# Patient Record
Sex: Male | Born: 1985 | Race: White | Hispanic: No | Marital: Married | Smoking: Current every day smoker
Health system: Southern US, Community
[De-identification: ages and names within clinical notes are randomized; demographics above are authoritative.]

## PROBLEM LIST (undated history)

## (undated) DIAGNOSIS — F1020 Alcohol dependence, uncomplicated: Secondary | ICD-10-CM

## (undated) DIAGNOSIS — IMO0001 Reserved for inherently not codable concepts without codable children: Secondary | ICD-10-CM

## (undated) DIAGNOSIS — J45909 Unspecified asthma, uncomplicated: Secondary | ICD-10-CM

## (undated) DIAGNOSIS — I10 Essential (primary) hypertension: Secondary | ICD-10-CM

## (undated) HISTORY — PX: CLUB FOOT RELEASE: SHX1363

---

## 2005-04-03 ENCOUNTER — Emergency Department (HOSPITAL_COMMUNITY): Admission: EM | Admit: 2005-04-03 | Discharge: 2005-04-03 | Payer: Self-pay | Admitting: Emergency Medicine

## 2005-07-15 ENCOUNTER — Emergency Department (HOSPITAL_COMMUNITY): Admission: EM | Admit: 2005-07-15 | Discharge: 2005-07-15 | Payer: Self-pay | Admitting: Emergency Medicine

## 2011-06-13 ENCOUNTER — Emergency Department (HOSPITAL_COMMUNITY)
Admission: EM | Admit: 2011-06-13 | Discharge: 2011-06-13 | Disposition: A | Discharge status: Home / Self Care | Payer: Worker's Compensation | Attending: Emergency Medicine | Admitting: Emergency Medicine

## 2011-06-13 DIAGNOSIS — W260XXA Contact with knife, initial encounter: Secondary | ICD-10-CM | POA: Insufficient documentation

## 2011-06-13 DIAGNOSIS — S61409A Unspecified open wound of unspecified hand, initial encounter: Secondary | ICD-10-CM | POA: Insufficient documentation

## 2011-06-13 DIAGNOSIS — Y9269 Other specified industrial and construction area as the place of occurrence of the external cause: Secondary | ICD-10-CM | POA: Insufficient documentation

## 2014-11-19 ENCOUNTER — Emergency Department (HOSPITAL_COMMUNITY)
Admission: EM | Admit: 2014-11-19 | Discharge: 2014-11-19 | Disposition: A | Discharge status: Home / Self Care | Payer: BLUE CROSS/BLUE SHIELD | Attending: Emergency Medicine | Admitting: Emergency Medicine

## 2014-11-19 ENCOUNTER — Encounter (HOSPITAL_COMMUNITY): Payer: Self-pay | Admitting: *Deleted

## 2014-11-19 DIAGNOSIS — Z008 Encounter for other general examination: Secondary | ICD-10-CM | POA: Diagnosis present

## 2014-11-19 DIAGNOSIS — F1029 Alcohol dependence with unspecified alcohol-induced disorder: Secondary | ICD-10-CM

## 2014-11-19 DIAGNOSIS — F1120 Opioid dependence, uncomplicated: Secondary | ICD-10-CM | POA: Diagnosis present

## 2014-11-19 DIAGNOSIS — I10 Essential (primary) hypertension: Secondary | ICD-10-CM | POA: Insufficient documentation

## 2014-11-19 DIAGNOSIS — Z720 Tobacco use: Secondary | ICD-10-CM | POA: Insufficient documentation

## 2014-11-19 DIAGNOSIS — F1110 Opioid abuse, uncomplicated: Secondary | ICD-10-CM | POA: Diagnosis not present

## 2014-11-19 DIAGNOSIS — F1210 Cannabis abuse, uncomplicated: Secondary | ICD-10-CM | POA: Diagnosis not present

## 2014-11-19 DIAGNOSIS — F10230 Alcohol dependence with withdrawal, uncomplicated: Secondary | ICD-10-CM | POA: Diagnosis present

## 2014-11-19 HISTORY — DX: Alcohol dependence, uncomplicated: F10.20

## 2014-11-19 HISTORY — DX: Reserved for inherently not codable concepts without codable children: IMO0001

## 2014-11-19 HISTORY — DX: Essential (primary) hypertension: I10

## 2014-11-19 LAB — COMPREHENSIVE METABOLIC PANEL
ALBUMIN: 4.8 g/dL (ref 3.5–5.2)
ALT: 25 U/L (ref 0–53)
AST: 35 U/L (ref 0–37)
Alkaline Phosphatase: 73 U/L (ref 39–117)
Anion gap: 9 (ref 5–15)
BUN: 13 mg/dL (ref 6–23)
CALCIUM: 9.4 mg/dL (ref 8.4–10.5)
CHLORIDE: 105 mmol/L (ref 96–112)
CO2: 26 mmol/L (ref 19–32)
Creatinine, Ser: 0.79 mg/dL (ref 0.50–1.35)
GFR calc Af Amer: >90 mL/min (ref >90)
GFR calc non Af Amer: >90 mL/min (ref >90)
Glucose, Bld: 93 mg/dL (ref 70–99)
POTASSIUM: 3.8 mmol/L (ref 3.5–5.1)
SODIUM: 140 mmol/L (ref 135–145)
TOTAL PROTEIN: 8.1 g/dL (ref 6.0–8.3)
Total Bilirubin: 0.8 mg/dL (ref 0.3–1.2)

## 2014-11-19 LAB — ETHANOL

## 2014-11-19 LAB — CBC
HEMATOCRIT: 48.9 % (ref 39.0–52.0)
Hemoglobin: 16.8 g/dL (ref 13.0–17.0)
MCH: 33.4 pg (ref 26.0–34.0)
MCHC: 34.4 g/dL (ref 30.0–36.0)
MCV: 97.2 fL (ref 78.0–100.0)
PLATELETS: 235 10*3/uL (ref 150–400)
RBC: 5.03 MIL/uL (ref 4.22–5.81)
RDW: 12.5 % (ref 11.5–15.5)
WBC: 13.2 10*3/uL — ABNORMAL HIGH (ref 4.0–10.5)

## 2014-11-19 LAB — RAPID URINE DRUG SCREEN, HOSP PERFORMED
Amphetamines: NOT DETECTED
BARBITURATES: NOT DETECTED
BENZODIAZEPINES: NOT DETECTED
Cocaine: NOT DETECTED
Opiates: POSITIVE — AB
Tetrahydrocannabinol: POSITIVE — AB

## 2014-11-19 LAB — SALICYLATE LEVEL

## 2014-11-19 LAB — ACETAMINOPHEN LEVEL: Acetaminophen (Tylenol), Serum: <10 ug/mL — ABNORMAL LOW (ref 10–30)

## 2014-11-19 MED ORDER — LORAZEPAM 1 MG PO TABS: 0.0000 mg | ORAL_TABLET | Freq: Two times a day (BID) | ORAL | Status: DC

## 2014-11-19 MED ORDER — ONDANSETRON HCL 4 MG PO TABS: 4.0000 mg | ORAL_TABLET | Freq: Three times a day (TID) | ORAL | Status: DC | PRN

## 2014-11-19 MED ORDER — LORAZEPAM 1 MG PO TABS
0.0000 mg | ORAL_TABLET | Freq: Four times a day (QID) | ORAL | Status: DC
  Administered 2014-11-19: 1 mg via ORAL
  Filled 2014-11-19: qty 1

## 2014-11-19 MED ORDER — ZOLPIDEM TARTRATE 5 MG PO TABS: 5.0000 mg | ORAL_TABLET | Freq: Every evening | ORAL | Status: DC | PRN

## 2014-11-19 MED ORDER — NICOTINE 21 MG/24HR TD PT24
21.0000 mg | MEDICATED_PATCH | Freq: Every day | TRANSDERMAL | Status: DC
  Administered 2014-11-19: 21 mg via TRANSDERMAL
  Filled 2014-11-19: qty 1

## 2014-11-19 MED ORDER — IBUPROFEN 200 MG PO TABS: 600.0000 mg | ORAL_TABLET | Freq: Three times a day (TID) | ORAL | Status: DC | PRN

## 2014-11-19 MED ORDER — LORAZEPAM 1 MG PO TABS: 1.0000 mg | ORAL_TABLET | Freq: Four times a day (QID) | ORAL | Status: DC | PRN

## 2014-11-19 NOTE — BH Assessment (Signed)
Pt signed consent to release info for [facility], [facility] and [facility]. Pt given following outpatient resources including info for [facility].  [facility]    [facility] [address]     [address] [place]     [place], [place] [phone]      [phone] Both a day and evening program   *Accepts MCD  [facility]  [facility] [address]     [address] [place]      [place], [place] [address] [phone]      [phone]  [facility]: [facility]    [facility] - Intensive Outpatient [address]     [address] [place], [place] [address]     [place], [place]  [phone]      [phone] [address] [place], [place] [address] [phone] *Accepts MCD      Residential Treatment Programs ASAP Residential Treatment    [facility] [address]     [address] [place]      [place], [place] [phone]      [phone] or [phone]  [facility]     The [address] (Several in [place]) [address], [place] [address]    [address] [place] [address]     [place], [place] [phone]      [phone]  [facility]   [facility] ([facility]) [address]     [address] [place], [place] [address]     [place], [place] [phone]      [phone] Admissions: 8am-3pm M-F  Self-Help/Support Groups [facility]   Narcotics Anonymous (NA) Variety of support groups    [facility] [phone] (call for more info)    [address]        [place] - 2 meetings at this location These referrals have been provided to you as appropriate for your clinical needs while taking into account your financial concerns. Please  be aware that agencies, practitioners and insurance companies sometimes change contracts. When calling to make an appointment have your insurance information available so the professional you are going to see can confirm whether they are covered by your plan. Take this form with you in case the person you are seeing needs a copy or to contact us.  __________________________________________ Assessment Counselor  Evette Cristalaroline Paige Armanie Martine, [place]CSWA Assessment Counselor

## 2014-11-19 NOTE — ED Notes (Signed)
Black suitcase removed from locker #36 to locker #27.

## 2014-11-19 NOTE — ED Provider Notes (Signed)
CSN: [id number]     Arrival date & time 11/19/14  1449 History   First MD Initiated Contact with Patient 11/19/14 1550     Chief Complaint  Patient presents with  . Medical Clearance     (Consider location/radiation/quality/duration/timing/severity/associated sxs/prior Treatment) HPI Christopher Molina is a 29 y.o. male with hx of htn, alcohol abuse, presents to ED requesting detox. Pt reports daily drinking and using opiod pills. Denies IV drug use. Admits to marijuana use. Denies any medical complaints. Denies thinking about hurting himself or anyone else. No hx of detox in the past. States tried to stop drinking on his own but gets "shakes." Nothing helping his symptoms. Nothing making them worse. States supposed to be seeing a therapist but he is not.   Past Medical History  Diagnosis Date  . Alcoholism /alcohol abuse   . Hypertension    Past Surgical History  Procedure Laterality Date  . Club foot release     No family history on file. History  Substance Use Topics  . Smoking status: Current Every Day Smoker  . Smokeless tobacco: Not on file  . Alcohol Use: Yes     Comment: 12pk a day    Review of Systems  Constitutional: Negative for fever and chills.  Respiratory: Negative for cough, chest tightness and shortness of breath.   Cardiovascular: Negative for chest pain, palpitations and leg swelling.  Gastrointestinal: Negative for nausea, vomiting, abdominal pain, diarrhea and abdominal distention.  Genitourinary: Negative for dysuria, urgency, frequency and hematuria.  Musculoskeletal: Negative for myalgias, arthralgias, neck pain and neck stiffness.  Skin: Negative for rash.  Allergic/Immunologic: Negative for immunocompromised state.  Neurological: Negative for dizziness, weakness, light-headedness, numbness and headaches.  Psychiatric/Behavioral: Negative for confusion and agitation.      Allergies  Review of patient's allergies indicates no known  allergies.  Home Medications   Prior to Admission medications   Not on File   BP 164/92 mmHg  Pulse 96  Temp(Src) 98 F (36.7 C) (Oral)  Resp 18  SpO2 100% Physical Exam  Constitutional: He is oriented to person, place, and time. He appears well-developed and well-nourished. No distress.  HENT:  Head: Normocephalic and atraumatic.  Eyes: Conjunctivae are normal.  Neck: Neck supple.  Cardiovascular: Normal rate, regular rhythm and normal heart sounds.   Pulmonary/Chest: Effort normal. No respiratory distress. He has no wheezes. He has no rales.  Abdominal: Soft. Bowel sounds are normal. He exhibits no distension. There is no tenderness. There is no rebound.  Musculoskeletal: He exhibits no edema.  Neurological: He is alert and oriented to person, place, and time.  Skin: Skin is warm and dry.  Nursing note and vitals reviewed.   ED Course  Procedures (including critical care time) Labs Review Labs Reviewed  ACETAMINOPHEN LEVEL - Abnormal; Notable for the following:    Acetaminophen (Tylenol), Serum <10.0 (*)    All other components within normal limits  CBC - Abnormal; Notable for the following:    WBC 13.2 (*)    All other components within normal limits  URINE RAPID DRUG SCREEN (HOSP PERFORMED) - Abnormal; Notable for the following:    Opiates POSITIVE (*)    Tetrahydrocannabinol POSITIVE (*)    All other components within normal limits  COMPREHENSIVE METABOLIC PANEL  ETHANOL  SALICYLATE LEVEL    Imaging Review No results found.   EKG Interpretation None      MDM   Final diagnoses:  Alcohol dependence with unspecified alcohol-induced disorder  Opiate  abuse, continuous    Pt here for alcohol and opiods detox. No hx of detox. Last drank yesterday. Will start on CIWA. TTS consult.    1:10 AM Pt medically cleared. No SI or HI   Lottie Musselatyana A Meilah Delrosario, PA-C 11/20/14 0110  Toy BakerAnthony T Allen, MD 11/21/14 [id number]

## 2014-11-19 NOTE — BH Assessment (Addendum)
Assessment Note  Christopher Molina is an 29 y.o. male. Writer spoke w/ Christopher Fillers PA-C prior to assessment. UDS & BAL had not been collected at time of assessment. Pt's cousins are bedside during assesment - Christopher Molina & Christopher Molina.[phone]  Pt is cooperative. He is oriented x 4. Pt's affect is irritable and anxious and he gives one word answers. Pt reports he has been ingesting approx 10 mg percocet 6 to 7 pills daily for past 4 years. He says that he also uses vicodin when available. Pt says he swallows the pills whole. Pt's last use was 11/18/14. Pt sts he has been drinking approx twelve 12 oz beers daily for the past 4 years. Pt sts his last use was 11/18/14. Pt denies hx of substance abuse. Pt denies SI and HI. He denies [facility] and no delusions noted. Pt sts he decided to get detox for alcohol and opioids for his family - pt lives w/ his wife and his 84 yo daughter. Pt sts he works days at [employer] in [place]. Pt reports following depressive sxs: loss of interest in usual pleasures, isolating bx, hopelessness, guilt, worthlessness, irritability. He reports poor appetite with unintentional loss of 10 lbs and decreased sleep. He reports moderate anxiety and reports most recent panic attack was two years ago after his grandmother's death. He reports family hx of substance abuse, suicide and mental illness. Pt reports no hx of inpatient or outpatient MH treatment. Christopher Molina reports she spoke w/ Christopher Molina of CD IOP at [facility] earlier today and Christopher Squibb wants pt to enroll in CD IOP. Writer ran pt by Christopher Means NP who recommends outpatient SA treatment as pt doesn't meet inpatient criteria at [facility] d/t no SI, HI or [facility].    Axis I:  Opioid Use Disorder, Severe             Alcohol Use Disorder, Moderate            Substance Induced Mood D/O Axis II: Deferred Axis III:  Past Medical History  Diagnosis Date  . Alcoholism /alcohol abuse   . Hypertension    Axis IV: other psychosocial or environmental problems,  problems related to social environment and problems with primary support group Axis V: 51-60 moderate symptoms  Past Medical History:  Past Medical History  Diagnosis Date  . Alcoholism /alcohol abuse   . Hypertension     Past Surgical History  Procedure Laterality Date  . Club foot release      Family History: No family history on file.  Social History:  reports that he has been smoking.  He does not have any smokeless tobacco history on file. He reports that he drinks alcohol. He reports that he uses illicit drugs (Marijuana and Oxycodone).  Additional Social History:  Alcohol / Drug Use Pain Medications: pt reports abuse of opioids Prescriptions: pt reports abuse of opioids Over the Counter: pt reports abuse of opioids History of alcohol / drug use?: Yes Longest period of sobriety (when/how long): not amount of cleana and sober time in past 4 yrs Negative Consequences of Use: Personal relationships Substance #1 Name of Substance 1: opioids 1 - Age of First Use: 24 1 - Amount (size/oz): approx 6 to 7 perocet 10 mg or vicodin  1 - Frequency: daily 1 - Duration: for past 4 years 1 - Last Use / Amount: 11/18/14 - unknown amount Substance #2 Name of Substance 2: alcohol 2 - Age of First Use: teenager 2 - Amount (size/oz):  twelve 12 oz beers 2 - Frequency: daily 2 - Duration: for 4 years 2 - Last Use / Amount: 11/18/14 - unknown amount  CIWA: CIWA-Ar BP: 162/90 mmHg Pulse Rate: 93 Nausea and Vomiting: no nausea and no vomiting Tactile Disturbances: none Tremor: no tremor Auditory Disturbances: very mild harshness or ability to frighten Paroxysmal Sweats: barely perceptible sweating, palms moist Visual Disturbances: not present Anxiety: moderately anxious, or guarded, so anxiety is inferred Headache, Fullness in Head: mild Agitation: somewhat more than normal activity Orientation and Clouding of Sensorium: oriented and can do serial additions CIWA-Ar Total: 9 COWS:     Allergies: No Known Allergies  Home Medications:  (Not in a hospital admission)  OB/GYN Status:  No LMP for male patient.  General Assessment Data Location of Assessment: [facility] Is this a Tele or Face-to-Face Assessment?: Face-to-Face Is this an Initial Assessment or a Re-assessment for this encounter?: Initial Assessment Living Arrangements: Spouse/significant other, Children, Other (Comment) (wife & 71 yo girl) Can pt return to current living arrangement?: Yes Admission Status: Voluntary Is patient capable of signing voluntary admission?: Yes Transfer from: Home Referral Source: Self/Family/Friend     [facility] Crisis Care Plan Living Arrangements: Spouse/significant other, Children, Other (Comment) (wife & 57 yo girl) Name of Psychiatrist: none Name of Therapist: none  Education Status Is patient currently in school?: No Highest grade of school patient has completed: 9  Risk to self with the past 6 months Suicidal Ideation: No Suicidal Intent: No Is patient at risk for suicide?: No Suicidal Plan?: No Access to Molina: No What has been your use of drugs/alcohol within the last 12 months?: daily alcohol and opioid use Previous Attempts/Gestures: No How many times?: 0 Other Self Harm Risks: none Triggers for Past Attempts:  (n/a) Intentional Self Injurious Behavior: None Family Suicide History: Yes (he endorses family hx of suicide hx, SA & MI) Recent stressful life event(s): Other (Comment) (substance abuse) Persecutory voices/beliefs?: No Depression: Yes Depression Symptoms: Loss of interest in usual pleasures, Feeling angry/irritable, Feeling worthless/self pity, Fatigue, Guilt, Isolating, Insomnia, Despondent Substance abuse history and/or treatment for substance abuse?: Yes Suicide prevention information given to non-admitted patients: Not applicable  Risk to Others within the past 6 months Homicidal Ideation: No Thoughts of Harm to Others: No Current Homicidal  Intent: No Current Homicidal Plan: No Access to Homicidal Molina: No Identified Victim: none History of harm to others?: No Assessment of Violence: None Noted Violent Behavior Description: pt denies hx violence Does patient have access to weapons?: No Criminal Charges Pending?: No Does patient have a court date: No  Psychosis Hallucinations: None noted Delusions: None noted  Mental Status Report Appear/Hygiene: In hospital gown Eye Contact: Fair Motor Activity: Freedom of movement Speech: Logical/coherent Level of Consciousness: Alert, Quiet/awake, Irritable Mood: Depressed, Anhedonia, Sad, Irritable Affect: Appropriate to circumstance, Anxious, Irritable Anxiety Level: Moderate Thought Processes: Relevant, Coherent Judgement: Unimpaired Orientation: Place, Person, Time, Situation Obsessive Compulsive Thoughts/Behaviors: None  Cognitive Functioning Concentration: Normal Memory: Recent Intact, Remote Intact IQ: Average Insight: Fair Impulse Control: Poor Appetite: Poor Weight Loss: 10 Sleep: Decreased Total Hours of Sleep: 4 Vegetative Symptoms: None  ADLScreening [facility] Assessment Services) Patient's cognitive ability adequate to safely complete daily activities?: Yes Patient able to express need for assistance with ADLs?: Yes Independently performs ADLs?: Yes (appropriate for developmental age)  Prior Inpatient Therapy Prior Inpatient Therapy: No Prior Therapy Dates: na Prior Therapy Facilty/Provider(s): na Reason for Treatment: na  Prior Outpatient Therapy Prior Outpatient Therapy: No Prior Therapy Dates:  na Prior Therapy Facilty/Provider(s): na Reason for Treatment: na  ADL Screening (condition at time of admission) Patient's cognitive ability adequate to safely complete daily activities?: Yes Is the patient deaf or have difficulty hearing?: No Does the patient have difficulty seeing, even when wearing glasses/contacts?: No Does the patient have  difficulty concentrating, remembering, or making decisions?: No Patient able to express need for assistance with ADLs?: Yes Does the patient have difficulty dressing or bathing?: No Independently performs ADLs?: Yes (appropriate for developmental age) Does the patient have difficulty walking or climbing stairs?: No Weakness of Legs: None Weakness of Arms/Hands: None  Home Assistive Devices/Equipment Home Assistive Devices/Equipment: None    Abuse/Neglect Assessment (Assessment to be complete while patient is alone) Physical Abuse: Denies Verbal Abuse: Denies Sexual Abuse: Denies Exploitation of patient/patient's resources: Denies Self-Neglect: Denies     Merchant navy officerAdvance Directives (For Healthcare) Does patient have an advance directive?: No    Additional Information 1:1 In Past 12 Months?: No CIRT Risk: No Elopement Risk: No Does patient have medical clearance?: Yes     Disposition:  Disposition Initial Assessment Completed for this Encounter: Yes Disposition of Patient: Outpatient treatment Type of outpatient treatment: Chemical Dependence - Intensive Outpatient Catha Nottingham(jamison lord NP recommends outpatient IOP)  On Site Evaluation by:   Reviewed with Physician:    Donnamarie RossettiMCLEAN, Saida Lonon P 11/19/2014 5:18 PM

## 2014-11-19 NOTE — ED Notes (Signed)
Patient discharged in care of the his Cousin. Escorted out by [employer] Sophie. Patient ambulatory. Not in any distress. Vital signs stable. Discharged instructions explain to the patient and understanding verbalized. Prescription given and instructions explained.

## 2014-11-19 NOTE — ED Notes (Signed)
Meal tray given to patient.

## 2014-11-19 NOTE — ED Notes (Signed)
Pt reports etoh use, 12pk a day for past 3 years. Percocet 10mg , taking 6-7 a day for 4 years. Marijuana use every day. Pt requesting detox and medical clearance. Denies SI/HI, AVH.

## 2014-11-19 NOTE — ED Notes (Signed)
Christopher RacerCousin [name] [phone]

## 2014-11-19 NOTE — ED Notes (Addendum)
Patient belongings sheet was completed and patient belongings are secured in locker 236. Patient's belongings include 4 shirts, 4 pants/shorts, 5 pairs of socks, 3 undergarments, 1 sweater/jacket, toothpaste, 1 toiletry bag with deodorant, toothbrush, and mouthwash in one black travel suitcase.

## 2014-11-19 NOTE — ED Notes (Signed)
Social Work at bedside 

## 2014-11-20 ENCOUNTER — Emergency Department: Payer: Self-pay | Admitting: Emergency Medicine

## 2014-11-20 LAB — COMPREHENSIVE METABOLIC PANEL
ALBUMIN: 4.2 g/dL (ref 3.4–5.0)
ALK PHOS: 69 U/L (ref 46–116)
AST: 35 U/L (ref 15–37)
Anion Gap: 7 (ref 7–16)
BUN: 13 mg/dL (ref 7–18)
Bilirubin,Total: 0.5 mg/dL (ref 0.2–1.0)
CALCIUM: 8.8 mg/dL (ref 8.5–10.1)
CO2: 30 mmol/L (ref 21–32)
Chloride: 102 mmol/L (ref 98–107)
Creatinine: 1.05 mg/dL (ref 0.60–1.30)
EGFR (African American): >60
EGFR (Non-African Amer.): >60
Glucose: 80 mg/dL (ref 65–99)
OSMOLALITY: 277 (ref 275–301)
Potassium: 3.1 mmol/L — ABNORMAL LOW (ref 3.5–5.1)
SGPT (ALT): 33 U/L (ref 14–63)
Sodium: 139 mmol/L (ref 136–145)
Total Protein: 7.7 g/dL (ref 6.4–8.2)

## 2014-11-20 LAB — CBC
HCT: 47.1 % (ref 40.0–52.0)
HGB: 15.7 g/dL (ref 13.0–18.0)
MCH: 32.9 pg (ref 26.0–34.0)
MCHC: 33.3 g/dL (ref 32.0–36.0)
MCV: 99 fL (ref 80–100)
PLATELETS: 214 10*3/uL (ref 150–440)
RBC: 4.76 10*6/uL (ref 4.40–5.90)
RDW: 13 % (ref 11.5–14.5)
WBC: 10.6 10*3/uL (ref 3.8–10.6)

## 2014-11-20 LAB — URINALYSIS, COMPLETE
Bacteria: NONE SEEN
Bilirubin,UR: NEGATIVE
Blood: NEGATIVE
Glucose,UR: NEGATIVE mg/dL (ref 0–75)
KETONE: NEGATIVE
Leukocyte Esterase: NEGATIVE
NITRITE: NEGATIVE
Ph: 6 (ref 4.5–8.0)
Protein: NEGATIVE
RBC, UR: NONE SEEN /HPF (ref 0–5)
SPECIFIC GRAVITY: 1.004 (ref 1.003–1.030)
Squamous Epithelial: NONE SEEN

## 2014-11-20 LAB — DRUG SCREEN, URINE
Amphetamines, Ur Screen: NEGATIVE (ref ?–1000)
BARBITURATES, UR SCREEN: NEGATIVE (ref ?–200)
Benzodiazepine, Ur Scrn: NEGATIVE (ref ?–200)
CANNABINOID 50 NG, UR ~~LOC~~: NEGATIVE (ref ?–50)
COCAINE METABOLITE, UR ~~LOC~~: NEGATIVE (ref ?–300)
MDMA (ECSTASY) UR SCREEN: NEGATIVE (ref ?–500)
Methadone, Ur Screen: NEGATIVE (ref ?–300)
OPIATE, UR SCREEN: POSITIVE (ref ?–300)
Phencyclidine (PCP) Ur S: NEGATIVE (ref ?–25)
Tricyclic, Ur Screen: NEGATIVE (ref ?–1000)

## 2014-11-20 LAB — ACETAMINOPHEN LEVEL: Acetaminophen: <2 ug/mL

## 2014-11-20 LAB — ETHANOL

## 2014-11-20 LAB — SALICYLATE LEVEL: Salicylates, Serum: 4 mg/dL — ABNORMAL HIGH

## 2015-08-03 ENCOUNTER — Encounter (HOSPITAL_COMMUNITY): Payer: Self-pay | Admitting: Emergency Medicine

## 2015-08-03 ENCOUNTER — Emergency Department (HOSPITAL_COMMUNITY)
Admission: EM | Admit: 2015-08-03 | Discharge: 2015-08-03 | Disposition: A | Discharge status: Home / Self Care | Payer: BLUE CROSS/BLUE SHIELD | Attending: Emergency Medicine | Admitting: Emergency Medicine

## 2015-08-03 ENCOUNTER — Emergency Department (HOSPITAL_COMMUNITY): Payer: BLUE CROSS/BLUE SHIELD

## 2015-08-03 DIAGNOSIS — R05 Cough: Secondary | ICD-10-CM | POA: Diagnosis present

## 2015-08-03 DIAGNOSIS — I10 Essential (primary) hypertension: Secondary | ICD-10-CM | POA: Insufficient documentation

## 2015-08-03 DIAGNOSIS — Z720 Tobacco use: Secondary | ICD-10-CM | POA: Diagnosis not present

## 2015-08-03 DIAGNOSIS — J0120 Acute ethmoidal sinusitis, unspecified: Secondary | ICD-10-CM | POA: Insufficient documentation

## 2015-08-03 DIAGNOSIS — J45909 Unspecified asthma, uncomplicated: Secondary | ICD-10-CM

## 2015-08-03 DIAGNOSIS — J45901 Unspecified asthma with (acute) exacerbation: Secondary | ICD-10-CM | POA: Insufficient documentation

## 2015-08-03 MED ORDER — IPRATROPIUM-ALBUTEROL 0.5-2.5 (3) MG/3ML IN SOLN
3.0000 mL | Freq: Once | RESPIRATORY_TRACT | Status: AC
  Administered 2015-08-03: 3 mL via RESPIRATORY_TRACT
  Filled 2015-08-03: qty 3

## 2015-08-03 MED ORDER — AMOXICILLIN-POT CLAVULANATE 875-125 MG PO TABS: 1.0000 | ORAL_TABLET | Freq: Two times a day (BID) | ORAL | Status: DC

## 2015-08-03 MED ORDER — ALBUTEROL SULFATE HFA 108 (90 BASE) MCG/ACT IN AERS: 1.0000 | INHALATION_SPRAY | Freq: Four times a day (QID) | RESPIRATORY_TRACT | Status: DC | PRN

## 2015-08-03 NOTE — Discharge Instructions (Signed)
Asthma Attack Prevention While you may not be able to control the fact that you have asthma, you can take actions to prevent asthma attacks. The best way to prevent asthma attacks is to maintain good control of your asthma. You can achieve this by:  Taking your medicines as directed.  Avoiding things that can irritate your airways or make your asthma symptoms worse (asthma triggers).  Keeping track of how well your asthma is controlled and of any changes in your symptoms.  Responding quickly to worsening asthma symptoms (asthma attack).  Seeking emergency care when it is needed. WHAT ARE SOME WAYS TO PREVENT AN ASTHMA ATTACK? Have a Plan Work with your health care provider to create a written plan for managing and treating your asthma attacks (asthma action plan). This plan includes:  A list of your asthma triggers and how you can avoid them.  Information on when medicines should be taken and when their dosages should be changed.  The use of a device that measures how well your lungs are working (peak flow meter). Monitor Your Asthma Use your peak flow meter and record your results in a journal every day. A drop in your peak flow numbers on one or more days may indicate the start of an asthma attack. This can happen even before you start to feel symptoms. You can prevent an asthma attack from getting worse by following the steps in your asthma action plan. Avoid Asthma Triggers Work with your asthma health care provider to find out what your asthma triggers are. This can be done by:  Allergy testing.  Keeping a journal that notes when asthma attacks occur and the factors that may have contributed to them.  Determining if there are other medical conditions that are making your asthma worse. Once you have determined your asthma triggers, take steps to avoid them. This may include avoiding excessive or prolonged exposure to:  Dust. Have someone dust and vacuum your home for you once or  twice a week. Using a high-efficiency particulate arrestance (HEPA) vacuum is best.  Smoke. This includes campfire smoke, forest fire smoke, and secondhand smoke from tobacco products.  Pet dander. Avoid contact with animals that you know you are allergic to.  Allergens from trees, grasses or pollens. Avoid spending a lot of time outdoors when pollen counts are high, and on very windy days.  Very cold, dry, or humid air.  Mold.  Foods that contain high amounts of sulfites.  Strong odors.  Outdoor air pollutants, such as engine exhaust.  Indoor air pollutants, such as aerosol sprays and fumes from household cleaners.  Household pests, including dust mites and cockroaches, and pest droppings.  Certain medicines, including NSAIDs. Always talk to your health care provider before stopping or starting any new medicines. Medicines Take over-the-counter and prescription medicines only as told by your health care provider. Many asthma attacks can be prevented by carefully following your medicine schedule. Taking your medicines correctly is especially important when you cannot avoid certain asthma triggers. Act Quickly If an asthma attack does happen, acting quickly can decrease how severe it is and how long it lasts. Take these steps:   Pay attention to your symptoms. If you are coughing, wheezing, or having difficulty breathing, do not wait to see if your symptoms go away on their own. Follow your asthma action plan.  If you have followed your asthma action plan and your symptoms are not improving, call your health care provider or seek immediate medical care   at the nearest hospital. It is important to note how often you need to use your fast-acting rescue inhaler. If you are using your rescue inhaler more often, it may mean that your asthma is not under control. Adjusting your asthma treatment plan may help you to prevent future asthma attacks and help you to gain better control of your  condition. HOW CAN I PREVENT AN ASTHMA ATTACK WHEN I EXERCISE? Follow advice from your health care provider about whether you should use your fast-acting inhaler before exercising. Many people with asthma experience exercise-induced bronchoconstriction (EIB). This condition often worsens during vigorous exercise in cold, humid, or dry environments. Usually, people with EIB can stay very active by pre-treating with a fast-acting inhaler before exercising.   This information is not intended to replace advice given to you by your health care provider. Make sure you discuss any questions you have with your health care provider.   Document Released: 09/26/2009 Document Revised: 06/29/2015 Document Reviewed: 03/10/2015 Elsevier Interactive Patient Education 2016 [employer]vier Inc.  

## 2015-08-03 NOTE — ED Notes (Signed)
Pt reports sinus infection x2 weeks with head congestions, fever/chills, drainage, sneezing and coughing.  Pt states he has not had any tx for this issue.

## 2015-08-03 NOTE — ED Provider Notes (Signed)
CSN: [id number]     Arrival date & time 08/03/15  4098 History   First MD Initiated Contact with Patient 08/03/15 1024     Chief Complaint  Patient presents with  . Facial Pain     (Consider location/radiation/quality/duration/timing/severity/associated sxs/prior Treatment) Patient is a 29 y.o. male presenting with cough. The history is provided by the patient. No language interpreter was used.  Cough Cough characteristics:  Productive Sputum characteristics:  Nondescript Severity:  Moderate Onset quality:  Gradual Timing:  Constant Progression:  Worsening Chronicity:  New Smoker: no   Context: upper respiratory infection   Relieved by:  Nothing Worsened by:  Nothing tried Ineffective treatments:  None tried Associated symptoms: shortness of breath, sinus congestion and sore throat     Past Medical History  Diagnosis Date  . Alcoholism /alcohol abuse (HCC)   . Hypertension    Past Surgical History  Procedure Laterality Date  . Club foot release     History reviewed. No pertinent family history. Social History  Substance Use Topics  . Smoking status: Current Every Day Smoker -- 1.00 packs/day for 10 years    Types: Cigarettes  . Smokeless tobacco: None  . Alcohol Use: No     Comment: 12pk a day    Review of Systems  HENT: Positive for sore throat.   Respiratory: Positive for cough and shortness of breath.   All other systems reviewed and are negative.     Allergies  Review of patient's allergies indicates no known allergies.  Home Medications   Prior to Admission medications   Medication Sig Start Date End Date Taking? Authorizing Provider  LORazepam (ATIVAN) 1 MG tablet Take 1 tablet (1 mg total) by mouth 4 (four) times daily as needed (withdrawal symptoms). 11/20/14   Charm Rings, NP   BP 143/86 mmHg  Pulse 71  Temp(Src) 98.2 F (36.8 C) (Oral)  Resp 18  Ht  (1.854 m)  Wt 160 lb (72.576 kg)  BMI 21.11 kg/m2  SpO2 98% Physical Exam   Constitutional: He is oriented to person, place, and time. He appears well-developed and well-nourished.  HENT:  Head: Normocephalic.  Right Ear: External ear normal.  Left Ear: External ear normal.  Maxillary tenderness  Eyes: Conjunctivae and EOM are normal. Pupils are equal, round, and reactive to light.  Neck: Normal range of motion.  Cardiovascular: Normal rate and normal heart sounds.   Pulmonary/Chest: Effort normal.  Wheezing    Abdominal: Soft. Bowel sounds are normal.  Musculoskeletal: Normal range of motion.  Neurological: He is alert and oriented to person, place, and time. He has normal reflexes.  Skin: Skin is warm.  Psychiatric: He has a normal mood and affect.  Nursing note and vitals reviewed.   ED Course  Procedures (including critical care time) Labs Review Labs Reviewed - No data to display  Imaging Review Dg Chest 2 View  08/03/2015  CLINICAL DATA:  Cough, fever. EXAM: CHEST  2 VIEW COMPARISON:  None. FINDINGS: The heart size and mediastinal contours are within normal limits. Both lungs are clear. No pneumothorax or pleural effusion is noted. The visualized skeletal structures are unremarkable. IMPRESSION: No active cardiopulmonary disease. Electronically Signed   By: Lupita Raider, M.D.   On: 08/03/2015 11:27   I have personally reviewed and evaluated these images and lab results as part of my medical decision-making.   EKG Interpretation None      MDM  Pt given albuterol/atrovent neb.  Final diagnoses:  Acute ethmoidal sinusitis, recurrence not specified  Bronchial asthma, unspecified asthma severity, uncomplicated    Pt given rx of albuterol and augmentin.  Pt advised to stop smoking.     Lonia SkinnerLeslie K MacySofia, PA-C 08/03/15 1253  Bethann BerkshireJoseph Zammit, MD 08/04/15 1537

## 2018-01-16 ENCOUNTER — Ambulatory Visit (INDEPENDENT_AMBULATORY_CARE_PROVIDER_SITE_OTHER): Payer: Self-pay

## 2018-01-16 ENCOUNTER — Encounter (INDEPENDENT_AMBULATORY_CARE_PROVIDER_SITE_OTHER): Payer: Self-pay | Admitting: Orthopaedic Surgery

## 2018-01-16 ENCOUNTER — Ambulatory Visit (INDEPENDENT_AMBULATORY_CARE_PROVIDER_SITE_OTHER): Payer: BLUE CROSS/BLUE SHIELD | Admitting: Orthopaedic Surgery

## 2018-01-16 VITALS — BP 182/118 | HR 83 | Ht 72.0 in | Wt 178.0 lb

## 2018-01-16 DIAGNOSIS — M545 Low back pain, unspecified: Secondary | ICD-10-CM | POA: Insufficient documentation

## 2018-01-16 NOTE — Addendum Note (Signed)
Addended by: Rogers SeedsYEATTS, Earnest Mcgillis M on: 01/16/2018 11:32 AM   Modules accepted: Orders

## 2018-01-16 NOTE — Progress Notes (Signed)
Office Visit Note   Patient: Christopher Molina           Date of Birth: 03-01-1986           MRN: [id number] Visit Date: 01/16/2018              Requested by: No referring provider defined for this encounter. PCP: Patient, No Pcp Per   Assessment & Plan: Visit Diagnoses:  1. Acute left-sided low back pain, with sciatica presence unspecified         Low back pain left side nerve root tension signs present.  Therapy ordered recheck 4 weeks.  Plan: We will set patient up for physical therapy recheck him in 4 weeks.  He can continue anti-inflammatories continue walking program.  Follow-Up Instructions: No follow-ups on file.   Orders:  Orders Placed This Encounter  Procedures  . XR Lumbar Spine 2-3 Views  . XR Pelvis 1-2 Views   No orders of the defined types were placed in this encounter.     Procedures: No procedures performed   Clinical Data: No additional findings.   Subjective: Chief Complaint  Patient presents with  . Lower Back - Pain  . Left Hip - Pain    HPI 32 year old male seen with onset of back and severe left leg pain times 3 weeks.  He is missed some work he was [facility] by [facility] with prednisone 20 mg 4, 3, 2, 1, Flexeril also ibuprofen.  Patient states originally can put his socks on.  He continues to have back pain left leg pain no pain on the right side.  No associated bowel or bladder symptoms.  History of disc degeneration.  Review of Systems previous clubfoot surgery as an infant times 2.  Patient smokes 1 pack/day he is a Control and instrumentation engineer married.  14 point review of systems otherwise negative.  Past history of alcohol problems and this was 2016.  Denies IV drug abuse.  Previous history of using some opioid pills   Objective: Vital Signs: BP (!) 182/118   Pulse 83   Ht 6' (1.829 m)   Wt 178 lb (80.7 kg)   BMI 24.14 kg/m   Physical Exam  Constitutional: He is oriented to person, place, and time. He appears well-developed and  well-nourished.  HENT:  Head: Normocephalic and atraumatic.  Eyes: Pupils are equal, round, and reactive to light. EOM are normal.  Neck: No tracheal deviation present. No thyromegaly present.  Cardiovascular: Normal rate.  Pulmonary/Chest: Effort normal. He has no wheezes.  Abdominal: Soft. Bowel sounds are normal.  Neurological: He is alert and oriented to person, place, and time.  Skin: Skin is warm and dry. Capillary refill takes less than 2 seconds.  Psychiatric: He has a normal mood and affect. His behavior is normal. Judgment and thought content normal.    Ortho Exam she has positive straight leg raising 80 degrees on the left positive popliteal compression test positive sciatic notch tenderness.  He is able to heel and toe walk.  Specialty Comments:  No specialty comments available.  Imaging: No results found.   PMFS History: Patient Active Problem List   Diagnosis Date Noted  . Alcohol dependence with uncomplicated withdrawal (HCC) 11/19/2014  . Opiate dependence (HCC) 11/19/2014   Past Medical History:  Diagnosis Date  . Alcoholism /alcohol abuse (HCC)   . Hypertension     No family history on file.  Past Surgical History:  Procedure Laterality Date  . CLUB FOOT RELEASE  Social History   Occupational History  . Occupation: Control and instrumentation engineerpress operator  Tobacco Use  . Smoking status: Current Every Day Smoker    Packs/day: 1.00    Years: 10.00    Pack years: 10.00    Types: Cigarettes  . Smokeless tobacco: Never Used  Substance and Sexual Activity  . Alcohol use: Yes    Comment: occasional  . Drug use: Not Currently    Comment: MJ daily, percocet recreationally, 6-7 10mg  tabs a day  . Sexual activity: Not on file

## 2018-02-13 ENCOUNTER — Encounter (INDEPENDENT_AMBULATORY_CARE_PROVIDER_SITE_OTHER): Payer: Self-pay | Admitting: Orthopaedic Surgery

## 2018-02-13 ENCOUNTER — Ambulatory Visit (INDEPENDENT_AMBULATORY_CARE_PROVIDER_SITE_OTHER): Payer: BLUE CROSS/BLUE SHIELD | Admitting: Orthopaedic Surgery

## 2018-02-13 VITALS — BP 143/93 | HR 89 | Ht 72.0 in | Wt 178.0 lb

## 2018-02-13 DIAGNOSIS — M545 Low back pain: Secondary | ICD-10-CM | POA: Diagnosis not present

## 2018-02-13 NOTE — Progress Notes (Signed)
Office Visit Note   Patient: Christopher Molina           Date of Birth: 04/08/1986           MRN: [id number] Visit Date: 02/13/2018              Requested by: No referring provider defined for this encounter. PCP: Patient, No Pcp Per   Assessment & Plan: Visit Diagnoses:  1. Acute left-sided low back pain, with sciatica presence unspecified     Plan: We will proceed with MRI scan for persistent back and left leg pain difficulty working.  Office follow-up after MRI scan for review.  Failed muscle relaxants anti-inflammatories exercise program prednisone Dosepak.  Follow-Up Instructions: No follow-ups on file.   Orders:  Orders Placed This Encounter  Procedures  . MR Lumbar Spine w/o contrast   No orders of the defined types were placed in this encounter.     Procedures: No procedures performed   Clinical Data: No additional findings.   Subjective: Chief Complaint  Patient presents with  . Lower Back - Pain, Follow-up    HPI 32 year old male returns for ongoing problems with back pain and leg pain.  He had one physical therapy visit for home exercise program and states it hurts too much for him to do the exercises.  Ibuprofen 800 mg 3-4 times a day states is not any better.  We discussed maximum 1600 mg ibuprofen recommendations.  He has had symptoms for 2 months.  Prednisone, Flexeril.  Past history of alcohol abuse history of p.o. opioid pill use.  He worked as a Control and instrumentation engineerpress operator.  He continues to smoke 1 pack/day.  Denies fever chills no bowel bladder symptoms.  Review of Systems 14 point review of systems updated unchanged from 01/16/2018 office visit.  Negative for history of IV drug abuse.   Objective: Vital Signs: BP (!) 143/93   Pulse 89   Ht 6' (1.829 m)   Wt 178 lb (80.7 kg)   BMI 24.14 kg/m   Physical Exam  Constitutional: He is oriented to person, place, and time. He appears well-developed and well-nourished.  HENT:  Head: Normocephalic and  atraumatic.  Eyes: Pupils are equal, round, and reactive to light. EOM are normal.  Neck: No tracheal deviation present. No thyromegaly present.  Cardiovascular: Normal rate.  Pulmonary/Chest: Effort normal. He has no wheezes.  Abdominal: Soft. Bowel sounds are normal.  Neurological: He is alert and oriented to person, place, and time.  Skin: Skin is warm and dry. Capillary refill takes less than 2 seconds.  Psychiatric: He has a normal mood and affect. His behavior is normal. Judgment and thought content normal.    Ortho Exam patient has short stride gait.  He has pain on the left side.  Some pain with popliteal compression test.  Is able heel and toe walk.  Reflexes are 2+ and symmetrical.  Specialty Comments:  No specialty comments available.  Imaging: No results found.   PMFS History: Patient Active Problem List   Diagnosis Date Noted  . Acute left-sided low back pain 01/16/2018  . Alcohol dependence with uncomplicated withdrawal (HCC) 11/19/2014  . Opiate dependence (HCC) 11/19/2014   Past Medical History:  Diagnosis Date  . Alcoholism /alcohol abuse (HCC)   . Hypertension     No family history on file.  Past Surgical History:  Procedure Laterality Date  . CLUB FOOT RELEASE     Social History   Occupational History  . Occupation: press  operator  Tobacco Use  . Smoking status: Current Every Day Smoker    Packs/day: 1.00    Years: 10.00    Pack years: 10.00    Types: Cigarettes  . Smokeless tobacco: Never Used  Substance and Sexual Activity  . Alcohol use: Yes    Comment: occasional  . Drug use: Not Currently    Comment: MJ daily, percocet recreationally, 6-7 10mg  tabs a day  . Sexual activity: Not on file

## 2018-02-20 ENCOUNTER — Encounter (INDEPENDENT_AMBULATORY_CARE_PROVIDER_SITE_OTHER): Payer: Self-pay | Admitting: Orthopaedic Surgery

## 2018-02-21 ENCOUNTER — Encounter: Payer: Self-pay | Admitting: Orthopaedic Surgery

## 2018-02-27 ENCOUNTER — Encounter (INDEPENDENT_AMBULATORY_CARE_PROVIDER_SITE_OTHER): Payer: Self-pay | Admitting: Orthopaedic Surgery

## 2018-02-27 ENCOUNTER — Ambulatory Visit (INDEPENDENT_AMBULATORY_CARE_PROVIDER_SITE_OTHER): Payer: BLUE CROSS/BLUE SHIELD | Admitting: Orthopaedic Surgery

## 2018-02-27 DIAGNOSIS — M5116 Intervertebral disc disorders with radiculopathy, lumbar region: Secondary | ICD-10-CM | POA: Diagnosis not present

## 2018-02-27 MED ORDER — HYDROCODONE-ACETAMINOPHEN 5-325 MG PO TABS: ORAL_TABLET | ORAL | 0 refills | Status: DC

## 2018-02-27 NOTE — H&P (View-Only) (Signed)
Office Visit Note   Patient: Christopher Molina           Date of Birth: 06/20/1986           MRN: [id number] Visit Date: 02/27/2018              Requested by: No referring provider defined for this encounter. PCP: Patient, No Pcp Per   Assessment & Plan: Visit Diagnoses:  1. Lumbar disc herniation with radiculopathy      LEFT  L5-S1  Plan: Patient has large disc herniation with nerve root tension signs significant pain failed anti-inflammatories muscle relaxants, prednisone Dosepak.  Has history of alcohol abuse  None currently.  Norco 5/325 30 tablets 1-2 p.o. to 6 hours as needed pain he will not take it when he works and his wife will monitor this.  Left L5-S1 microdiscectomy recommended with overnight stay.  He understands he will be out of work for 6 weeks after the surgery.  Risk of recurrent disc herniation discussed.  Risks of dural tear risks of anesthesia discussed.  That after the surgery with the quick Wollin with his past history of alcohol dependency.  Procedure discussed questions were elicited and answered he understands request to proceed.  He like to continue to work until his surgery is approved and would like to have it scheduled as soon as he can.  Follow-Up Instructions: Preop for left L5-S1 microdiscectomy.  Orders:  No orders of the defined types were placed in this encounter.  Meds ordered this encounter  Medications  . HYDROcodone-acetaminophen (NORCO/VICODIN) 5-325 MG tablet    Sig: Take 1-2 tablets every 4-6 hours prn pain.    Dispense:  30 tablet    Refill:  0      Procedures: No procedures performed   Clinical Data: No additional findings.   Subjective: Chief Complaint  Patient presents with  . Lower Back - Pain, Follow-up    MRI review    HPI 32 year old male returns with ongoing severe back and left leg pain times more than 4 months.  Is been on muscle relaxants, anti-inflammatories prednisone packs without relief.  Sit he can either lay  down or stand up.  His wife is with him today and states something has to be done.  MRI scan has been performed and is available for review.  Patient rates his pain is 6-9 out of 10 but is continued to work.  Physical therapy it made his pain worse he is continue to try to do his home exercises.  At times he was taking more ibuprofen than recommended.  Patient had trouble with alcohol abuse in the past but not currently.  Review of Systems positive for clubfoot surgery as an infant x2.  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  His wife.  Past history of alcohol abuse 2016.  For IV drug abuse.  Patient did use some opioid pills back when he was drinking  heavily 2016.   Objective: Vital Signs: There were no vitals taken for this visit.  Physical Exam  Constitutional: He is oriented to person, place, and time. He appears well-developed and well-nourished.  HENT:  Head: Normocephalic and atraumatic.  Eyes: Pupils are equal, round, and reactive to light. EOM are normal.  Neck: No tracheal deviation present. No thyromegaly present.  Cardiovascular: Normal rate.  Pulmonary/Chest: Effort normal. He has no wheezes.  Abdominal: Soft. Bowel sounds are normal.  Neurological: He is alert and oriented to person, place, and time.  Skin: Skin is warm and dry. Capillary refill takes less than 2 seconds.  Psychiatric: He has a normal mood and affect. His behavior is normal. Judgment and thought content normal.    Ortho Exam patient has extreme sciatic notch tenderness to the left positive straight leg raising 70 degrees.  He can walk on his heels and toes but cannot do single stance left toe raises.  No right sciatic notch tenderness.  Positive popliteal compression test on the left.  Anterior tib EHL is strong both right and left.  Quads are strong.  Normal hip range of motion negative SI joint testing.  Distal pulses are 2+ and symmetrical.  Specialty Comments:  No specialty comments  available.  Imaging: Lumbar MRI 02/23/2008 shows moderate to large disc extrusion on the left with significant mass-effect on the left S1 nerve root.  Mild disc bulge on the right at L4-5 with mild narrowing without compression.  All other levels above this are normal.  This desiccation at L4-5 and L5-S1.   PMFS History: Patient Active Problem List   Diagnosis Date Noted  . Acute left-sided low back pain 01/16/2018  . Alcohol dependence with uncomplicated withdrawal (HCC) 11/19/2014  . Opiate dependence (HCC) 11/19/2014   Past Medical History:  Diagnosis Date  . Alcoholism /alcohol abuse (HCC)   . Hypertension     No family history on file.  Past Surgical History:  Procedure Laterality Date  . CLUB FOOT RELEASE     Social History   Occupational History  . Occupation: press operator  Tobacco Use  . Smoking status: Current Every Day Smoker    Packs/day: 1.00    Years: 10.00    Pack years: 10.00    Types: Cigarettes  . Smokeless tobacco: Never Used  Substance and Sexual Activity  . Alcohol use: Yes    Comment: occasional  . Drug use: Not Currently    Comment: MJ daily, percocet recreationally, 6-7 10mg tabs a day  . Sexual activity: Not on file       

## 2018-02-27 NOTE — Progress Notes (Signed)
Office Visit Note   Patient: Christopher Molina           Date of Birth: 06/20/1986           MRN: [id number] Visit Date: 02/27/2018              Requested by: No referring provider defined for this encounter. PCP: Patient, No Pcp Per   Assessment & Plan: Visit Diagnoses:  1. Lumbar disc herniation with radiculopathy      LEFT  L5-S1  Plan: Patient has large disc herniation with nerve root tension signs significant pain failed anti-inflammatories muscle relaxants, prednisone Dosepak.  Has history of alcohol abuse  None currently.  Norco 5/325 30 tablets 1-2 p.o. to 6 hours as needed pain he will not take it when he works and his wife will monitor this.  Left L5-S1 microdiscectomy recommended with overnight stay.  He understands he will be out of work for 6 weeks after the surgery.  Risk of recurrent disc herniation discussed.  Risks of dural tear risks of anesthesia discussed.  That after the surgery with the quick Wollin with his past history of alcohol dependency.  Procedure discussed questions were elicited and answered he understands request to proceed.  He like to continue to work until his surgery is approved and would like to have it scheduled as soon as he can.  Follow-Up Instructions: Preop for left L5-S1 microdiscectomy.  Orders:  No orders of the defined types were placed in this encounter.  Meds ordered this encounter  Medications  . HYDROcodone-acetaminophen (NORCO/VICODIN) 5-325 MG tablet    Sig: Take 1-2 tablets every 4-6 hours prn pain.    Dispense:  30 tablet    Refill:  0      Procedures: No procedures performed   Clinical Data: No additional findings.   Subjective: Chief Complaint  Patient presents with  . Lower Back - Pain, Follow-up    MRI review    HPI 32 year old male returns with ongoing severe back and left leg pain times more than 4 months.  Is been on muscle relaxants, anti-inflammatories prednisone packs without relief.  Sit he can either lay  down or stand up.  His wife is with him today and states something has to be done.  MRI scan has been performed and is available for review.  Patient rates his pain is 6-9 out of 10 but is continued to work.  Physical therapy it made his pain worse he is continue to try to do his home exercises.  At times he was taking more ibuprofen than recommended.  Patient had trouble with alcohol abuse in the past but not currently.  Review of Systems positive for clubfoot surgery as an infant x2.  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  His wife.  Past history of alcohol abuse 2016.  For IV drug abuse.  Patient did use some opioid pills back when he was drinking  heavily 2016.   Objective: Vital Signs: There were no vitals taken for this visit.  Physical Exam  Constitutional: He is oriented to person, place, and time. He appears well-developed and well-nourished.  HENT:  Head: Normocephalic and atraumatic.  Eyes: Pupils are equal, round, and reactive to light. EOM are normal.  Neck: No tracheal deviation present. No thyromegaly present.  Cardiovascular: Normal rate.  Pulmonary/Chest: Effort normal. He has no wheezes.  Abdominal: Soft. Bowel sounds are normal.  Neurological: He is alert and oriented to person, place, and time.  Skin: Skin is warm and dry. Capillary refill takes less than 2 seconds.  Psychiatric: He has a normal mood and affect. His behavior is normal. Judgment and thought content normal.    Ortho Exam patient has extreme sciatic notch tenderness to the left positive straight leg raising 70 degrees.  He can walk on his heels and toes but cannot do single stance left toe raises.  No right sciatic notch tenderness.  Positive popliteal compression test on the left.  Anterior tib EHL is strong both right and left.  Quads are strong.  Normal hip range of motion negative SI joint testing.  Distal pulses are 2+ and symmetrical.  Specialty Comments:  No specialty comments  available.  Imaging: Lumbar MRI 02/23/2008 shows moderate to large disc extrusion on the left with significant mass-effect on the left S1 nerve root.  Mild disc bulge on the right at L4-5 with mild narrowing without compression.  All other levels above this are normal.  This desiccation at L4-5 and L5-S1.   PMFS History: Patient Active Problem List   Diagnosis Date Noted  . Acute left-sided low back pain 01/16/2018  . Alcohol dependence with uncomplicated withdrawal (HCC) 11/19/2014  . Opiate dependence (HCC) 11/19/2014   Past Medical History:  Diagnosis Date  . Alcoholism /alcohol abuse (HCC)   . Hypertension     No family history on file.  Past Surgical History:  Procedure Laterality Date  . CLUB FOOT RELEASE     Social History   Occupational History  . Occupation: Control and instrumentation engineer  Tobacco Use  . Smoking status: Current Every Day Smoker    Packs/day: 1.00    Years: 10.00    Pack years: 10.00    Types: Cigarettes  . Smokeless tobacco: Never Used  Substance and Sexual Activity  . Alcohol use: Yes    Comment: occasional  . Drug use: Not Currently    Comment: MJ daily, percocet recreationally, 6-7  tabs a day  . Sexual activity: Not on file

## 2018-03-11 ENCOUNTER — Other Ambulatory Visit (INDEPENDENT_AMBULATORY_CARE_PROVIDER_SITE_OTHER): Payer: Self-pay | Admitting: Radiology

## 2018-03-11 ENCOUNTER — Telehealth (INDEPENDENT_AMBULATORY_CARE_PROVIDER_SITE_OTHER): Payer: Self-pay | Admitting: Radiology

## 2018-03-11 MED ORDER — HYDROCODONE-ACETAMINOPHEN 5-325 MG PO TABS: ORAL_TABLET | ORAL | 0 refills | Status: DC

## 2018-03-11 NOTE — Telephone Encounter (Signed)
Ok for Rx for norco 5/325   # 20  One po bid prn pain.

## 2018-03-11 NOTE — Addendum Note (Signed)
Addended by: Penne Lash, Otis Dials on: 03/11/2018 05:37 PM   Modules accepted: Orders

## 2018-03-11 NOTE — Telephone Encounter (Signed)
Patient is scheduled for lumbar surgery on 03/24/18. He left voicemail requesting refill on medication until surgery. Please advise.

## 2018-03-11 NOTE — Telephone Encounter (Signed)
I tried to call to let him know his rx is ready but there was no answer, I will try again later

## 2018-03-14 NOTE — Telephone Encounter (Signed)
Patients wife states that he got an rx at the Dr. [place] yesterday

## 2018-03-18 NOTE — Pre-Procedure Instructions (Addendum)
Christopher Molina  03/18/2018    Your procedure is scheduled on Monday, March 24, 2018 at 12:30 PM.   Report to [facility] Entrance "A" Admitting Office at 10:30 AM.   Call this number if you have problems the morning of surgery: [phone]   Questions prior to day of surgery, please call [phone] between 8 & 4 PM.   Remember:  Do not eat food or drink liquids after midnight, Sunday, 03/23/18.  Take these medicines the morning of surgery with A SIP OF WATER: Hydrocodone - if needed  Stop NSAIDS (Ibuprofen, Aleve, etc) as of today. Do not use Aspirin products prior to surgery.  No smoking 24 hours prior to surgery.    Do not wear jewelry.  Do not wear lotions, powders, cologne or deodorant.  Men may shave face and neck.  Do not bring valuables to the hospital.  [facility] is not responsible for any belongings or valuables.  Contacts, dentures or bridgework may not be worn into surgery.  Leave your suitcase in the car.  After surgery it may be brought to your room.  For patients admitted to the hospital, discharge time will be determined by your treatment team.  Patients discharged the day of surgery will not be allowed to drive home.   [place] - Preparing for Surgery  Before surgery, you can play an important role.  Because skin is not sterile, your skin needs to be as free of germs as possible.  You can reduce the number of germs on you skin by washing with CHG (chlorahexidine gluconate) soap before surgery.  CHG is an antiseptic cleaner which kills germs and bonds with the skin to continue killing germs even after washing.  Oral Hygiene is also important in reducing the risk of infection.  Remember to brush your teeth with your regular toothpaste the morning of surgery.  Please DO NOT use if you have an allergy to CHG or antibacterial soaps.  If your skin becomes reddened/irritated stop using the CHG and inform your nurse when you arrive at Short Stay.  Do not  shave (including legs and underarms) for at least 48 hours prior to the first CHG shower.  You may shave your face.  Please follow these instructions carefully:   1.  Shower with CHG Soap the night before surgery and the morning of Surgery.  2.  If you choose to wash your hair, wash your hair first as usual with your normal shampoo.  3.  After you shampoo, rinse your hair and body thoroughly to remove the shampoo. 4.  Use CHG as you would any other liquid soap.  You can apply chg directly to the skin and wash gently with a      scrungie or washcloth.           5.  Apply the CHG Soap to your body ONLY FROM THE NECK DOWN.   Do not use on open wounds or open sores. Avoid contact with your eyes, ears, mouth and genitals (private parts).  Wash genitals (private parts) with your normal soap.  6.  Wash thoroughly, paying special attention to the area where your surgery will be performed.  7.  Thoroughly rinse your body with warm water from the neck down.  8.  DO NOT shower/wash with your normal soap after using and rinsing off the CHG Soap.  9.  Pat yourself dry with a clean towel.  10.  Wear clean pajamas.            11.  Place clean sheets on your bed the night of your first shower and do not sleep with pets.  Day of Surgery  Shower as above. Do not apply any lotions/deodorants the morning of surgery.   Please wear clean clothes to the hospital. Remember to brush your teeth with toothpaste.    Please read over the fact sheets that you were given.

## 2018-03-19 ENCOUNTER — Encounter (HOSPITAL_COMMUNITY)
Admission: RE | Admit: 2018-03-19 | Discharge: 2018-03-19 | Disposition: A | Discharge status: Home / Self Care | Payer: BLUE CROSS/BLUE SHIELD | Source: Ambulatory Visit | Attending: Orthopaedic Surgery | Admitting: Orthopaedic Surgery

## 2018-03-19 ENCOUNTER — Other Ambulatory Visit: Payer: Self-pay

## 2018-03-19 ENCOUNTER — Encounter (HOSPITAL_COMMUNITY): Payer: Self-pay

## 2018-03-19 ENCOUNTER — Other Ambulatory Visit (HOSPITAL_COMMUNITY): Payer: Self-pay | Admitting: *Deleted

## 2018-03-19 DIAGNOSIS — I10 Essential (primary) hypertension: Secondary | ICD-10-CM | POA: Diagnosis not present

## 2018-03-19 DIAGNOSIS — Z01810 Encounter for preprocedural cardiovascular examination: Secondary | ICD-10-CM | POA: Diagnosis not present

## 2018-03-19 DIAGNOSIS — M5116 Intervertebral disc disorders with radiculopathy, lumbar region: Secondary | ICD-10-CM | POA: Diagnosis not present

## 2018-03-19 HISTORY — DX: Unspecified asthma, uncomplicated: J45.909

## 2018-03-19 LAB — SURGICAL PCR SCREEN
MRSA, PCR: NEGATIVE
Staphylococcus aureus: NEGATIVE

## 2018-03-19 LAB — CBC
HEMATOCRIT: 47.4 % (ref 39.0–52.0)
HEMOGLOBIN: 16.1 g/dL (ref 13.0–17.0)
MCH: 32.3 pg (ref 26.0–34.0)
MCHC: 34 g/dL (ref 30.0–36.0)
MCV: 95.2 fL (ref 78.0–100.0)
Platelets: 254 10*3/uL (ref 150–400)
RBC: 4.98 MIL/uL (ref 4.22–5.81)
RDW: 12.2 % (ref 11.5–15.5)
WBC: 14.9 10*3/uL — ABNORMAL HIGH (ref 4.0–10.5)

## 2018-03-19 LAB — COMPREHENSIVE METABOLIC PANEL
ALT: 19 U/L (ref 17–63)
AST: 27 U/L (ref 15–41)
Albumin: 4.5 g/dL (ref 3.5–5.0)
Alkaline Phosphatase: 72 U/L (ref 38–126)
Anion gap: 8 (ref 5–15)
BILIRUBIN TOTAL: 0.8 mg/dL (ref 0.3–1.2)
BUN: 6 mg/dL (ref 6–20)
CO2: 30 mmol/L (ref 22–32)
CREATININE: 1.01 mg/dL (ref 0.61–1.24)
Calcium: 9.8 mg/dL (ref 8.9–10.3)
Chloride: 100 mmol/L — ABNORMAL LOW (ref 101–111)
GFR calc Af Amer: >60 mL/min (ref >60)
Glucose, Bld: 98 mg/dL (ref 65–99)
POTASSIUM: 4.5 mmol/L (ref 3.5–5.1)
Sodium: 138 mmol/L (ref 135–145)
TOTAL PROTEIN: 7.7 g/dL (ref 6.5–8.1)

## 2018-03-19 NOTE — Progress Notes (Signed)
Pt denies cardiac history. Pt states he's never been treated for HTN, but has been told his blood pressure runs somewhat high. Pt states he is not diabetic. Pt has hx of heavy alcohol use in the past. States he still drinks 4-5 beers a day.

## 2018-03-19 NOTE — Progress Notes (Signed)
   03/19/18 1335  OBSTRUCTIVE SLEEP APNEA  Have you ever been diagnosed with sleep apnea through a sleep study? No  Do you snore loudly (loud enough to be heard through closed doors)?  1  Do you often feel tired, fatigued, or sleepy during the daytime (such as falling asleep during driving or talking to someone)? 1  Has anyone observed you stop breathing during your sleep? 1  Do you have, or are you being treated for high blood pressure? 1  BMI more than 35 kg/m2? 0  Age > 50 (1-yes) 0  Neck circumference greater than:Male 16 inches or larger, Male 17inches or larger? 0  Male Gender (Yes=1) 1  Obstructive Sleep Apnea Score 5  Score 5 or greater  Results sent to PCP

## 2018-03-24 ENCOUNTER — Ambulatory Visit (HOSPITAL_COMMUNITY): Payer: BLUE CROSS/BLUE SHIELD | Admitting: Anesthesiology

## 2018-03-24 ENCOUNTER — Encounter (HOSPITAL_COMMUNITY)
Admission: RE | Disposition: A | Discharge status: Home / Self Care | Payer: Self-pay | Source: Ambulatory Visit | Attending: Orthopaedic Surgery

## 2018-03-24 ENCOUNTER — Ambulatory Visit (HOSPITAL_COMMUNITY)
Admission: RE | Admit: 2018-03-24 | Discharge: 2018-03-24 | Disposition: A | Discharge status: Home / Self Care | Payer: BLUE CROSS/BLUE SHIELD | Source: Ambulatory Visit | Attending: Orthopaedic Surgery | Admitting: Orthopaedic Surgery

## 2018-03-24 ENCOUNTER — Encounter (HOSPITAL_COMMUNITY): Payer: Self-pay | Admitting: Anesthesiology

## 2018-03-24 ENCOUNTER — Ambulatory Visit (HOSPITAL_COMMUNITY): Payer: BLUE CROSS/BLUE SHIELD

## 2018-03-24 DIAGNOSIS — M5116 Intervertebral disc disorders with radiculopathy, lumbar region: Secondary | ICD-10-CM

## 2018-03-24 DIAGNOSIS — F17210 Nicotine dependence, cigarettes, uncomplicated: Secondary | ICD-10-CM | POA: Diagnosis not present

## 2018-03-24 DIAGNOSIS — Z419 Encounter for procedure for purposes other than remedying health state, unspecified: Secondary | ICD-10-CM

## 2018-03-24 DIAGNOSIS — M5117 Intervertebral disc disorders with radiculopathy, lumbosacral region: Secondary | ICD-10-CM | POA: Diagnosis present

## 2018-03-24 HISTORY — PX: LUMBAR LAMINECTOMY: SHX95

## 2018-03-24 SURGERY — MICRODISCECTOMY LUMBAR LAMINECTOMY
Anesthesia: General | Site: Spine Lumbar

## 2018-03-24 MED ORDER — DEXAMETHASONE SODIUM PHOSPHATE 10 MG/ML IJ SOLN
INTRAMUSCULAR | Status: AC
  Filled 2018-03-24: qty 1

## 2018-03-24 MED ORDER — ALBUTEROL SULFATE HFA 108 (90 BASE) MCG/ACT IN AERS
INHALATION_SPRAY | RESPIRATORY_TRACT | Status: DC | PRN
  Administered 2018-03-24: 6 via RESPIRATORY_TRACT

## 2018-03-24 MED ORDER — CEFAZOLIN SODIUM-DEXTROSE 2-4 GM/100ML-% IV SOLN
2.0000 g | INTRAVENOUS | Status: AC
  Administered 2018-03-24: 2 g via INTRAVENOUS

## 2018-03-24 MED ORDER — FENTANYL CITRATE (PF) 250 MCG/5ML IJ SOLN
INTRAMUSCULAR | Status: DC | PRN
  Administered 2018-03-24 (×4): 50 ug via INTRAVENOUS
  Administered 2018-03-24: 125 ug via INTRAVENOUS
  Administered 2018-03-24 (×3): 50 ug via INTRAVENOUS
  Administered 2018-03-24: 25 ug via INTRAVENOUS

## 2018-03-24 MED ORDER — CEFAZOLIN SODIUM-DEXTROSE 2-4 GM/100ML-% IV SOLN
INTRAVENOUS | Status: AC
  Filled 2018-03-24: qty 100

## 2018-03-24 MED ORDER — BUPIVACAINE HCL (PF) 0.25 % IJ SOLN
INTRAMUSCULAR | Status: AC
  Filled 2018-03-24: qty 30

## 2018-03-24 MED ORDER — LACTATED RINGERS IV SOLN: INTRAVENOUS | Status: DC

## 2018-03-24 MED ORDER — PROPOFOL 10 MG/ML IV BOLUS
INTRAVENOUS | Status: AC
  Filled 2018-03-24: qty 20

## 2018-03-24 MED ORDER — LIDOCAINE 2% (20 MG/ML) 5 ML SYRINGE
INTRAMUSCULAR | Status: AC
  Filled 2018-03-24: qty 5

## 2018-03-24 MED ORDER — DEXAMETHASONE SODIUM PHOSPHATE 10 MG/ML IJ SOLN
INTRAMUSCULAR | Status: DC | PRN
  Administered 2018-03-24: 10 mg via INTRAVENOUS

## 2018-03-24 MED ORDER — MIDAZOLAM HCL 2 MG/2ML IJ SOLN
INTRAMUSCULAR | Status: AC
  Filled 2018-03-24: qty 2

## 2018-03-24 MED ORDER — OXYCODONE-ACETAMINOPHEN 5-325 MG PO TABS: 1.0000 | ORAL_TABLET | Freq: Four times a day (QID) | ORAL | 0 refills | Status: DC | PRN

## 2018-03-24 MED ORDER — FENTANYL CITRATE (PF) 100 MCG/2ML IJ SOLN
100.0000 ug | Freq: Once | INTRAMUSCULAR | Status: AC
  Administered 2018-03-24: 25 ug via INTRAVENOUS

## 2018-03-24 MED ORDER — ROCURONIUM BROMIDE 10 MG/ML (PF) SYRINGE
PREFILLED_SYRINGE | INTRAVENOUS | Status: AC
  Filled 2018-03-24: qty 5

## 2018-03-24 MED ORDER — SUGAMMADEX SODIUM 200 MG/2ML IV SOLN
INTRAVENOUS | Status: DC | PRN
  Administered 2018-03-24: 160 mg via INTRAVENOUS

## 2018-03-24 MED ORDER — PROPOFOL 10 MG/ML IV BOLUS
INTRAVENOUS | Status: DC | PRN
  Administered 2018-03-24: 200 mg via INTRAVENOUS

## 2018-03-24 MED ORDER — MEPERIDINE HCL 50 MG/ML IJ SOLN: 6.2500 mg | INTRAMUSCULAR | Status: DC | PRN

## 2018-03-24 MED ORDER — NEOSTIGMINE METHYLSULFATE 5 MG/5ML IV SOSY
PREFILLED_SYRINGE | INTRAVENOUS | Status: AC
  Filled 2018-03-24: qty 5

## 2018-03-24 MED ORDER — 0.9 % SODIUM CHLORIDE (POUR BTL) OPTIME
TOPICAL | Status: DC | PRN
  Administered 2018-03-24: 1000 mL

## 2018-03-24 MED ORDER — HYDROMORPHONE HCL 2 MG/ML IJ SOLN
0.2500 mg | INTRAMUSCULAR | Status: DC | PRN
  Administered 2018-03-24 (×4): 0.5 mg via INTRAVENOUS

## 2018-03-24 MED ORDER — SUGAMMADEX SODIUM 200 MG/2ML IV SOLN
INTRAVENOUS | Status: AC
  Filled 2018-03-24: qty 2

## 2018-03-24 MED ORDER — LACTATED RINGERS IV SOLN
INTRAVENOUS | Status: DC
  Administered 2018-03-24: 10 mL/h via INTRAVENOUS

## 2018-03-24 MED ORDER — PROMETHAZINE HCL 25 MG/ML IJ SOLN: 6.2500 mg | INTRAMUSCULAR | Status: DC | PRN

## 2018-03-24 MED ORDER — HYDROMORPHONE HCL 2 MG/ML IJ SOLN
INTRAMUSCULAR | Status: AC
  Administered 2018-03-24: 0.5 mg via INTRAVENOUS
  Filled 2018-03-24: qty 1

## 2018-03-24 MED ORDER — CHLORHEXIDINE GLUCONATE 4 % EX LIQD: 60.0000 mL | Freq: Once | CUTANEOUS | Status: DC

## 2018-03-24 MED ORDER — ONDANSETRON HCL 4 MG/2ML IJ SOLN
INTRAMUSCULAR | Status: DC | PRN
  Administered 2018-03-24: 4 mg via INTRAVENOUS

## 2018-03-24 MED ORDER — FENTANYL CITRATE (PF) 100 MCG/2ML IJ SOLN
INTRAMUSCULAR | Status: AC
  Administered 2018-03-24: 25 ug
  Filled 2018-03-24: qty 2

## 2018-03-24 MED ORDER — LIDOCAINE 2% (20 MG/ML) 5 ML SYRINGE
INTRAMUSCULAR | Status: DC | PRN
  Administered 2018-03-24: 50 mg via INTRAVENOUS

## 2018-03-24 MED ORDER — ROCURONIUM BROMIDE 10 MG/ML (PF) SYRINGE
PREFILLED_SYRINGE | INTRAVENOUS | Status: DC | PRN
  Administered 2018-03-24: 20 mg via INTRAVENOUS
  Administered 2018-03-24: 50 mg via INTRAVENOUS

## 2018-03-24 MED ORDER — BUPIVACAINE HCL (PF) 0.25 % IJ SOLN
INTRAMUSCULAR | Status: DC | PRN
  Administered 2018-03-24: 10 mL

## 2018-03-24 MED ORDER — METHOCARBAMOL 500 MG PO TABS: 500.0000 mg | ORAL_TABLET | Freq: Four times a day (QID) | ORAL | 0 refills | Status: AC | PRN

## 2018-03-24 MED ORDER — MIDAZOLAM HCL 5 MG/5ML IJ SOLN
INTRAMUSCULAR | Status: DC | PRN
  Administered 2018-03-24: 2 mg via INTRAVENOUS

## 2018-03-24 MED ORDER — LACTATED RINGERS IV SOLN
INTRAVENOUS | Status: DC | PRN
  Administered 2018-03-24 (×2): via INTRAVENOUS

## 2018-03-24 MED ORDER — ALBUTEROL SULFATE HFA 108 (90 BASE) MCG/ACT IN AERS
INHALATION_SPRAY | RESPIRATORY_TRACT | Status: AC
  Filled 2018-03-24: qty 6.7

## 2018-03-24 MED ORDER — FENTANYL CITRATE (PF) 250 MCG/5ML IJ SOLN
INTRAMUSCULAR | Status: AC
  Filled 2018-03-24: qty 5

## 2018-03-24 MED ORDER — ONDANSETRON HCL 4 MG/2ML IJ SOLN
INTRAMUSCULAR | Status: AC
  Filled 2018-03-24: qty 2

## 2018-03-24 SURGICAL SUPPLY — 45 items
ADH SKN CLS APL DERMABOND .7 (GAUZE/BANDAGES/DRESSINGS) ×1
APL SKNCLS STERI-STRIP NONHPOA (GAUZE/BANDAGES/DRESSINGS) ×1
BENZOIN TINCTURE PRP APPL 2/3 (GAUZE/BANDAGES/DRESSINGS) ×2 IMPLANT
BUR ROUND FLUTED 4 SOFT TCH (BURR) IMPLANT
BUR ROUND FLUTED 4MM SOFT TCH (BURR)
CANISTER SUCT 3000ML PPV (MISCELLANEOUS) ×3 IMPLANT
CLOSURE WOUND 1/2 X4 (GAUZE/BANDAGES/DRESSINGS) ×1
COVER SURGICAL LIGHT HANDLE (MISCELLANEOUS) ×3 IMPLANT
DERMABOND ADVANCED (GAUZE/BANDAGES/DRESSINGS) ×2
DERMABOND ADVANCED .7 DNX12 (GAUZE/BANDAGES/DRESSINGS) ×1 IMPLANT
DRAPE MICROSCOPE LEICA (MISCELLANEOUS) ×3 IMPLANT
DRSG MEPILEX BORDER 4X4 (GAUZE/BANDAGES/DRESSINGS) ×2 IMPLANT
DRSG MEPILEX BORDER 4X8 (GAUZE/BANDAGES/DRESSINGS) IMPLANT
DURAPREP 26ML APPLICATOR (WOUND CARE) ×3 IMPLANT
DURASEAL SPINE SEALANT 3ML (MISCELLANEOUS) IMPLANT
ELECT REM PT RETURN 9FT ADLT (ELECTROSURGICAL) ×3
ELECTRODE REM PT RTRN 9FT ADLT (ELECTROSURGICAL) ×1 IMPLANT
GAUZE SPONGE 4X4 12PLY STRL (GAUZE/BANDAGES/DRESSINGS) ×3 IMPLANT
GLOVE BIOGEL PI IND STRL 8 (GLOVE) ×2 IMPLANT
GLOVE BIOGEL PI INDICATOR 8 (GLOVE) ×4
GLOVE ORTHO TXT STRL SZ7.5 (GLOVE) ×6 IMPLANT
GOWN STRL REUS W/ TWL LRG LVL3 (GOWN DISPOSABLE) ×1 IMPLANT
GOWN STRL REUS W/ TWL XL LVL3 (GOWN DISPOSABLE) ×1 IMPLANT
GOWN STRL REUS W/TWL 2XL LVL3 (GOWN DISPOSABLE) ×3 IMPLANT
GOWN STRL REUS W/TWL LRG LVL3 (GOWN DISPOSABLE) ×3
GOWN STRL REUS W/TWL XL LVL3 (GOWN DISPOSABLE) ×3
KIT BASIN OR (CUSTOM PROCEDURE TRAY) ×3 IMPLANT
KIT TURNOVER KIT B (KITS) ×3 IMPLANT
NDL SPNL 18GX3.5 QUINCKE PK (NEEDLE) ×1 IMPLANT
NEEDLE SPNL 18GX3.5 QUINCKE PK (NEEDLE) ×3 IMPLANT
NS IRRIG 1000ML POUR BTL (IV SOLUTION) ×3 IMPLANT
PACK LAMINECTOMY ORTHO (CUSTOM PROCEDURE TRAY) ×3 IMPLANT
PAD ARMBOARD 7.5X6 YLW CONV (MISCELLANEOUS) ×6 IMPLANT
PATTIES SURGICAL .5 X.5 (GAUZE/BANDAGES/DRESSINGS) IMPLANT
PATTIES SURGICAL .75X.75 (GAUZE/BANDAGES/DRESSINGS) IMPLANT
STRIP CLOSURE SKIN 1/2X4 (GAUZE/BANDAGES/DRESSINGS) ×1 IMPLANT
SUT VIC AB 1 CTX 36 (SUTURE) ×3
SUT VIC AB 1 CTX36XBRD ANBCTR (SUTURE) ×1 IMPLANT
SUT VIC AB 2-0 CT1 27 (SUTURE) ×3
SUT VIC AB 2-0 CT1 TAPERPNT 27 (SUTURE) ×1 IMPLANT
SUT VIC AB 3-0 X1 27 (SUTURE) IMPLANT
SYR 20ML ECCENTRIC (SYRINGE) IMPLANT
TOWEL OR 17X24 6PK STRL BLUE (TOWEL DISPOSABLE) ×3 IMPLANT
TOWEL OR 17X26 10 PK STRL BLUE (TOWEL DISPOSABLE) ×3 IMPLANT
WATER STERILE IRR 1000ML POUR (IV SOLUTION) ×3 IMPLANT

## 2018-03-24 NOTE — Anesthesia Postprocedure Evaluation (Signed)
Anesthesia Post Note  Patient: Christopher Molina  Procedure(s) Performed: LEFT L5-S1 MICRODISCECTOMY (N/A Spine Lumbar)     Patient location during evaluation: PACU Anesthesia Type: General Level of consciousness: awake and alert Pain management: pain level controlled Vital Signs Assessment: post-procedure vital signs reviewed and stable Respiratory status: spontaneous breathing, nonlabored ventilation, respiratory function stable and patient connected to nasal cannula oxygen Cardiovascular status: blood pressure returned to baseline and stable Postop Assessment: no apparent nausea or vomiting Anesthetic complications: no    Last Vitals:  Vitals:   03/24/18 1439 03/24/18 1450  BP: (!) 167/96 (!) 162/87  Pulse: 93 91  Resp: 12 16  Temp: (!) 36.4 C   SpO2: 100% 95%    Last Pain:  Vitals:   03/24/18 1505  TempSrc:   PainSc: 6                  Shelton SilvasKevin D Johanne Mcglade

## 2018-03-24 NOTE — Anesthesia Preprocedure Evaluation (Addendum)
Anesthesia Evaluation  Patient identified by MRN, date of birth, ID band Patient awake    Reviewed: Allergy & Precautions, NPO status , Patient's Chart, lab work & pertinent test results  Airway Mallampati: II  TM Distance: >3 FB Neck ROM: Full    Dental  (+) Teeth Intact, Dental Advisory Given   Pulmonary asthma , Current Smoker,    breath sounds clear to auscultation       Cardiovascular hypertension,  Rhythm:Regular Rate:Normal     Neuro/Psych PSYCHIATRIC DISORDERS    GI/Hepatic negative GI ROS, Neg liver ROS,   Endo/Other  negative endocrine ROS  Renal/GU negative Renal ROS     Musculoskeletal negative musculoskeletal ROS (+)   Abdominal Normal abdominal exam  (+)   Peds  Hematology negative hematology ROS (+)   Anesthesia Other Findings   Reproductive/Obstetrics                            Lab Results  Component Value Date   WBC 14.9 (H) 03/19/2018   HGB 16.1 03/19/2018   HCT 47.4 03/19/2018   MCV 95.2 03/19/2018   PLT 254 03/19/2018   Lab Results  Component Value Date   CREATININE 1.01 03/19/2018   BUN 6 03/19/2018   NA 138 03/19/2018   K 4.5 03/19/2018   CL 100 (L) 03/19/2018   CO2 30 03/19/2018   No results found for: INR, PROTIME  EKG: normal sinus rhythm.  Anesthesia Physical Anesthesia Plan  ASA: II  Anesthesia Plan: General   Post-op Pain Management:    Induction: Intravenous  PONV Risk Score and Plan: 2 and Ondansetron, Dexamethasone and Midazolam  Airway Management Planned: Oral ETT  Additional Equipment: None  Intra-op Plan:   Post-operative Plan: Extubation in OR  Informed Consent: I have reviewed the patients History and Physical, chart, labs and discussed the procedure including the risks, benefits and alternatives for the proposed anesthesia with the patient or authorized representative who has indicated his/her understanding and acceptance.    Dental advisory given  Plan Discussed with: CRNA  Anesthesia Plan Comments:         Anesthesia Quick Evaluation

## 2018-03-24 NOTE — Anesthesia Procedure Notes (Addendum)
Procedure Name: Intubation Date/Time: 03/24/2018 1:10 PM Performed by: Nils PyleBell, Lennis Korb T, CRNA Pre-anesthesia Checklist: Patient identified, Emergency Drugs available, Suction available and Patient being monitored Patient Re-evaluated:Patient Re-evaluated prior to induction Oxygen Delivery Method: Circle System Utilized Preoxygenation: Pre-oxygenation with 100% oxygen Induction Type: IV induction Ventilation: Mask ventilation without difficulty Laryngoscope Size: Miller and 3 Grade View: Grade I Tube type: Oral Tube size: 7.5 mm Number of attempts: 1 Airway Equipment and Method: Stylet and Oral airway Placement Confirmation: ETT inserted through vocal cords under direct vision,  positive ETCO2 and breath sounds checked- equal and bilateral Secured at: 23 cm Tube secured with: Tape Dental Injury: Teeth and Oropharynx as per pre-operative assessment  Comments: Intubation by Otila KluverAlex Manning, SRNA

## 2018-03-24 NOTE — Progress Notes (Signed)
Patient ID: Christopher Molina, male   DOB: 02/14/1986, 32 y.o.   MRN: [id number] Pt walking in PACU. Voided 300cc.    Discharge home office 8 days. Avoid sitting. Walk daily slowly progress to 2 miles per day . Avoid sitting except to eat or use toilet. OK to be back in a recliner.

## 2018-03-24 NOTE — Interval H&P Note (Signed)
History and Physical Interval Note:  03/24/2018 12:52 PM  Christopher Molina  has presented today for surgery, with the diagnosis of LEFT L5-S1 HERNIATED NUCLEUS PULPOSUS WITH RADICULOPATHY  The various methods of treatment have been discussed with the patient and family. After consideration of risks, benefits and other options for treatment, the patient has consented to  Procedure(s): LEFT L5-S1 MICRODISCECTOMY (N/A) as a surgical intervention .  The patient's history has been reviewed, patient examined, no change in status, stable for surgery.  I have reviewed the patient's chart and labs.  Questions were answered to the patient's satisfaction.     Eldred MangesMark C Johneisha Broaden

## 2018-03-24 NOTE — Discharge Instructions (Addendum)
ORTHOPEDIC DISCHARGE INSTRUCTIONS  -No bending, lifting, twisting.  -Absolutely no driving  -Okay to shower 2 days postop.  Daily dressing changes with 4 x 4 gauze and tape.  Do not apply any creams or ointments to incision.  Do not remove Steri-Strips.  No tub soaking.  -If you begin to have any increase in pain you should contact our office immediately  - walk daily and slowly progress to 2 miles per day over a few weeks. OK to sit to eat or use toilet. OK to be back in a recliner with legs up. Ok to lay down on stomach , on your sides or flat on your back.  You may get some leg pain in left leg off and on at times and this is not unusual. See Dr. Ophelia CharterYates or Zonia KiefJames Owens PA-C his PA in 8 days. Call office [phone] for appt time.

## 2018-03-24 NOTE — Transfer of Care (Signed)
Immediate Anesthesia Transfer of Care Note  Patient: Christopher Molina  Procedure(s) Performed: LEFT L5-S1 MICRODISCECTOMY (N/A Spine Lumbar)  Patient Location: PACU  Anesthesia Type:General  Level of Consciousness: awake, alert  and oriented  Airway & Oxygen Therapy: Patient Spontanous Breathing  Post-op Assessment: Report given to RN, Post -op Vital signs reviewed and stable and Patient moving all extremities X 4  Post vital signs: Reviewed and stable  Last Vitals:  Vitals Value Taken Time  BP 167/96 03/24/2018  2:39 PM  Temp    Pulse 92 03/24/2018  2:42 PM  Resp 22 03/24/2018  2:42 PM  SpO2 96 % 03/24/2018  2:42 PM  Vitals shown include unvalidated device data.  Last Pain:  Vitals:   03/24/18 1059  TempSrc:   PainSc: 10-Worst pain ever      Patients Stated Pain Goal: 5 (03/24/18 1120)  Complications: No apparent anesthesia complications

## 2018-03-24 NOTE — Op Note (Signed)
Preop diagnosis: Left L5-S1 HNP with radiculopathy.  Postop diagnosis: Same  Procedure: Left L5-S1 microdiscectomy, lateral recess decompression.  Surgeon: Annell GreeningMark Bodhi Moradi, MD  Assistant: Zonia KiefJames Owens, PA-C medically necessary and present for the entire procedure  Anesthesia: General plus Marcaine local  EBL: Minimal  Findings: Large disc protrusion left paracentral with nerve root displacement and facet and ligamentous overgrowth causing lateral recess stenosis.  Procedure: After induction general anesthesia orotracheal intubation preoperative Ancef prophylaxis patient was placed prone careful padding positioning arms at 9090 with yellow foam pads underneath the ulnar nerve and rolled yellow foam underneath the anterior shoulders.  Calf pumpers were applied.  Back was prepped with DuraPrep area squared with towels.  Patient had a tattoo at the L5-S1 junction across the midline.  Betadine Steri-Drape was applied and 18-gauge spinal needle was placed crosstable lateral plain radiograph confirmed the needle was exactly at the L5-S1 interspace level.  Patient was then needle was removed after marking the skin and midline incision 2 mm to the left was made at the L5-S1 space.  Subperiosteal dissection with a McCullough retractor placed and Penfield 4 was placed at the interlaminar space and the second x-ray confirmed we are at the L5-S1 level in this thin male.  Operative microscope it been draped and was brought in.  Laminotomy was performed on the left and using the bur portion of the facet was trimmed back along the medial aspect.  Overhanging spurs from the facet were trimmed back and there was hypertrophic ligamentum causing lateral recess stenosis once this was removed the nerve root was mobilized toward the midline and the large disc protrusion which was more prominent than the previous MRI scan was noted.  Annulus was incised and ask in multiple chunks of large disc were removed decompressing the large  disc protrusion giving the nerve root relief.  Some bone was removed out to the level of the pedicle and some remaining chunks of ligamentum were removed from the lateral gutter until the nerve root was completely free.  Epstein curettes were used to the disc and loose pieces were removed.  Hockey-stick was able be placed anterior to the nerve root across to the midline and no areas remaining compression were present.  The nerve was free out the foramina and there was no compression more proximal.  A portion of the disc protrusion had pushed out over the edge of the endplates at L5 and S1 and this was removed by initially pulling fragments out and the disc was flat.  Epidural veins have been coagulated with the bipolar cautery.  Copious irrigation followed by closure of the fascia with #1 Vicryl to on subtenons tissues of particular skin closure.  Dermabond on the skin postop dressing and transferred to recovery room.  Patient expressed a desire to go home after the procedure and if he is able to void and ambulate in the recovery room he will be discharged.

## 2018-03-25 ENCOUNTER — Encounter (HOSPITAL_COMMUNITY): Payer: Self-pay | Admitting: Orthopaedic Surgery

## 2018-04-03 ENCOUNTER — Ambulatory Visit (INDEPENDENT_AMBULATORY_CARE_PROVIDER_SITE_OTHER): Payer: BLUE CROSS/BLUE SHIELD | Admitting: Orthopaedic Surgery

## 2018-04-03 ENCOUNTER — Encounter (INDEPENDENT_AMBULATORY_CARE_PROVIDER_SITE_OTHER): Payer: Self-pay | Admitting: Orthopaedic Surgery

## 2018-04-03 VITALS — BP 108/69 | HR 101 | Ht 72.0 in | Wt 178.0 lb

## 2018-04-03 DIAGNOSIS — Z9889 Other specified postprocedural states: Secondary | ICD-10-CM

## 2018-04-03 NOTE — Progress Notes (Signed)
   Post-Op Visit Note   Patient: Christopher Molina           Date of Birth: 11/04/1985           MRN: [id number] Visit Date: 04/03/2018 PCP: Patient, No Pcp Per   Assessment & Plan: Post left L5-S1 microdiscectomy on 03/24/2018.  Recheck 4 weeks continue walking program with goal of 2 miles per day.  Chief Complaint:  Chief Complaint  Patient presents with  . Lower Back - Routine Post Op   Visit Diagnoses:  1. Status post lumbar microdiscectomy     Plan: Patient is having some leg pain intermittently.  We will have him take some ibuprofen 800 mg twice a day with food.  Steri-Strips changed incision looks good he can cover with a Band-Aid  Follow-Up Instructions: Return in about 1 month (around 05/01/2018).   Orders:  No orders of the defined types were placed in this encounter.  No orders of the defined types were placed in this encounter.   Imaging: No results found.  PMFS History: Patient Active Problem List   Diagnosis Date Noted  . Acute left-sided low back pain 01/16/2018  . Alcohol dependence with uncomplicated withdrawal (HCC) 11/19/2014  . Opiate dependence (HCC) 11/19/2014   Past Medical History:  Diagnosis Date  . Alcoholism /alcohol abuse (HCC)   . Asthma    as a child  . Hypertension     Family History  Problem Relation Age of Onset  . COPD Mother   . Alcohol abuse Father     Past Surgical History:  Procedure Laterality Date  . CLUB FOOT RELEASE     x 2  . LUMBAR LAMINECTOMY N/A 03/24/2018   Procedure: LEFT L5-S1 MICRODISCECTOMY;  Surgeon: Eldred MangesYates, Corena Tilson C, MD;  [facility];  Service: Orthopedics;  Laterality: N/A;   Social History   Occupational History  . Occupation: Control and instrumentation engineerpress operator  Tobacco Use  . Smoking status: Current Every Day Smoker    Packs/day: 1.00    Years: 10.00    Pack years: 10.00    Types: Cigarettes  . Smokeless tobacco: Never Used  Substance and Sexual Activity  . Alcohol use: Yes    Alcohol/week: 21.0 oz    Types: 35 Cans  of beer per week    Comment: occasional  . Drug use: Not Currently    Comment: MJ daily, percocet recreationally, 6-7 10mg  tabs a day  . Sexual activity: Not on file

## 2018-05-08 ENCOUNTER — Ambulatory Visit (INDEPENDENT_AMBULATORY_CARE_PROVIDER_SITE_OTHER): Payer: BLUE CROSS/BLUE SHIELD | Admitting: Orthopaedic Surgery

## 2018-05-08 ENCOUNTER — Encounter (INDEPENDENT_AMBULATORY_CARE_PROVIDER_SITE_OTHER): Payer: Self-pay | Admitting: Orthopaedic Surgery

## 2018-05-08 VITALS — BP 143/85 | HR 78 | Ht 72.0 in | Wt 175.0 lb

## 2018-05-08 DIAGNOSIS — Z9889 Other specified postprocedural states: Secondary | ICD-10-CM

## 2018-05-08 NOTE — Progress Notes (Signed)
   Post-Op Visit Note   Patient: Christopher Molina           Date of Birth: 03/08/1986           MRN: [id number] Visit Date: 05/08/2018 PCP: Patient, No Pcp Per   Assessment & Plan: Post microdiscectomy L5-S1.  Inferior aspect incision had a small piece of Vicryl sticking out which was clipped off.  He is using some ibuprofen is walking daily.  He is gotten significant improvement from his preop pain.  Work slip given for work resumption on 05/12/2018 without restrictions.  He is happy with the surgical result.  Chief Complaint:  Chief Complaint  Patient presents with  . Lower Back - Routine Post Op   Visit Diagnoses:  1. Status post lumbar microdiscectomy     Plan: Patient can resume work on Monday.  He is happy with the surgical result and will return on a as needed basis.  Follow-Up Instructions: No follow-ups on file.   Orders:  No orders of the defined types were placed in this encounter.  No orders of the defined types were placed in this encounter.   Imaging: No results found.  PMFS History: Patient Active Problem List   Diagnosis Date Noted  . Acute left-sided low back pain 01/16/2018  . Alcohol dependence with uncomplicated withdrawal (HCC) 11/19/2014  . Opiate dependence (HCC) 11/19/2014   Past Medical History:  Diagnosis Date  . Alcoholism /alcohol abuse (HCC)   . Asthma    as a child  . Hypertension     Family History  Problem Relation Age of Onset  . COPD Mother   . Alcohol abuse Father     Past Surgical History:  Procedure Laterality Date  . CLUB FOOT RELEASE     x 2  . LUMBAR LAMINECTOMY N/A 03/24/2018   Procedure: LEFT L5-S1 MICRODISCECTOMY;  Surgeon: Eldred MangesYates, Lela Gell C, MD;  [facility];  Service: Orthopedics;  Laterality: N/A;   Social History   Occupational History  . Occupation: Control and instrumentation engineerpress operator  Tobacco Use  . Smoking status: Current Every Day Smoker    Packs/day: 1.00    Years: 10.00    Pack years: 10.00    Types: Cigarettes  .  Smokeless tobacco: Never Used  Substance and Sexual Activity  . Alcohol use: Yes    Alcohol/week: 21.0 oz    Types: 35 Cans of beer per week    Comment: occasional  . Drug use: Not Currently    Comment: MJ daily, percocet recreationally, 6-7 10mg  tabs a day  . Sexual activity: Not on file

## 2020-07-28 ENCOUNTER — Ambulatory Visit: Payer: Self-pay

## 2020-07-28 ENCOUNTER — Ambulatory Visit (INDEPENDENT_AMBULATORY_CARE_PROVIDER_SITE_OTHER): Payer: BC Managed Care – PPO | Admitting: Orthopaedic Surgery

## 2020-07-28 ENCOUNTER — Other Ambulatory Visit: Payer: Self-pay

## 2020-07-28 DIAGNOSIS — M545 Low back pain, unspecified: Secondary | ICD-10-CM | POA: Diagnosis not present

## 2020-07-28 MED ORDER — HYDROCODONE-ACETAMINOPHEN 5-325 MG PO TABS: 1.0000 | ORAL_TABLET | Freq: Four times a day (QID) | ORAL | 0 refills | Status: DC | PRN

## 2020-07-28 MED ORDER — PREDNISONE 10 MG (21) PO TBPK: ORAL_TABLET | ORAL | 0 refills | Status: DC

## 2020-07-28 NOTE — Progress Notes (Signed)
Office Visit Note   Patient: Christopher Molina           Date of Birth: 16-Dec-1985           MRN: [id number] Visit Date: 07/28/2020              Requested by: No referring provider defined for this encounter. PCP: Patient, No Pcp Per   Assessment & Plan: Visit Diagnoses:  1. Low back pain, unspecified back pain laterality, unspecified chronicity, unspecified whether sciatica present   2. Acute left-sided low back pain, unspecified whether sciatica present     Plan: Norco given for pain and uses sparingly and not when he is working or using any equipment.  Prednisone 10 mg Dosepak prescribed.  I will recheck him in 3 weeks if is not better then he will need MRI imaging with and without contrast to rule out recurrent HNP left L5-S1.  He will call if he develops progressive weakness.  Follow-Up Instructions: Return in about 3 weeks (around 08/18/2020).   Orders:  Orders Placed This Encounter  Procedures   XR Lumbar Spine 2-3 Views   Meds ordered this encounter  Medications   predniSONE (STERAPRED UNI-PAK 21 TAB) 10 MG (21) TBPK tablet    Sig: Take 6 tablets  first day, then 5,4,3,2,1 one tablet less each day with food    Dispense:  21 tablet    Refill:  0   HYDROcodone-acetaminophen (NORCO/VICODIN) 5-325 MG tablet    Sig: Take 1-2 tablets by mouth every 6 (six) hours as needed for moderate pain.    Dispense:  30 tablet    Refill:  0      Procedures: No procedures performed   Clinical Data: No additional findings.   Subjective: Chief Complaint  Patient presents with   Lower Back - Pain    HPI 34 year old male had left L5-S1 microdiscectomy by me June 2019 with low back pain for about 2 months off and on at times severe and gradually worse for the last 2 weeks to the point where he is having difficulty walking cannot get comfortable problems sitting laying.  He has back pain that radiates in his left buttocks down to his lateral ankle mostly pain in his calf and  posterior thigh.  He has not fallen.  Denies fever or chills.  He is used Tylenol and ibuprofen without relief as well as topical creams.  He has difficulty getting to position where he can get comfortable and has trouble sleeping.  Past history of alcohol dependence in the past with opioid dependence many years ago and he has been clean and doing well since well before his 2019 surgery.  Patient is continuing to work despite his pain.  Review of Systems all other systems are negative other than as mentioned in HPI.   Objective: Vital Signs: There were no vitals taken for this visit.  Physical Exam Constitutional:      Appearance: He is well-developed.  HENT:     Head: Normocephalic and atraumatic.  Eyes:     Pupils: Pupils are equal, round, and reactive to light.  Neck:     Thyroid: No thyromegaly.     Trachea: No tracheal deviation.  Cardiovascular:     Rate and Rhythm: Normal rate.  Pulmonary:     Effort: Pulmonary effort is normal.     Breath sounds: No wheezing.  Abdominal:     General: Bowel sounds are normal.     Palpations: Abdomen is soft.  Skin:    General: Skin is warm and dry.     Capillary Refill: Capillary refill takes less than 2 seconds.  Neurological:     Mental Status: He is alert and oriented to person, place, and time.  Psychiatric:        Behavior: Behavior normal.        Thought Content: Thought content normal.        Judgment: Judgment normal.     Ortho Exam patient has sharp pain with straight leg raising 70 degrees on the left.  He is able heel and toe walk.  Absent ankle jerk on the left 2+ knee jerk and 2+ ankle jerk on the right.  Decreased sensation lateral foot. Specialty Comments:  No specialty comments available.  Imaging: XR Lumbar Spine 2-3 Views  Result Date: 07/28/2020 AP lateral lumbar spine x-rays are obtained and reviewed.  This shows no spondylolisthesis.  No endplate sclerosis.  Mild positional curvature noted on AP x-ray.  Impression: Normal lumbar spine radiographs.    PMFS History: Patient Active Problem List   Diagnosis Date Noted   Acute left-sided low back pain 01/16/2018   Alcohol dependence with uncomplicated withdrawal (HCC) 11/19/2014   Opiate dependence (HCC) 11/19/2014   Past Medical History:  Diagnosis Date   Alcoholism /alcohol abuse (HCC)    Asthma    as a child   Hypertension     Family History  Problem Relation Age of Onset   COPD Mother    Alcohol abuse Father     Past Surgical History:  Procedure Laterality Date   CLUB FOOT RELEASE     x 2   LUMBAR LAMINECTOMY N/A 03/24/2018   Procedure: LEFT L5-S1 MICRODISCECTOMY;  Surgeon: Eldred Manges, MD;  Location: [facility];  Service: Orthopedics;  Laterality: N/A;   Social History   Occupational History   Occupation: Control and instrumentation engineer  Tobacco Use   Smoking status: Current Every Day Smoker    Packs/day: 1.00    Years: 10.00    Pack years: 10.00    Types: Cigarettes   Smokeless tobacco: Never Used  Building services engineer Use: Never used  Substance and Sexual Activity   Alcohol use: Yes    Alcohol/week: 35.0 standard drinks    Types: 35 Cans of beer per week    Comment: occasional   Drug use: Not Currently    Comment: MJ daily, percocet recreationally, 6-7 10mg  tabs a day   Sexual activity: Not on file

## 2020-08-18 ENCOUNTER — Other Ambulatory Visit: Payer: Self-pay

## 2020-08-18 ENCOUNTER — Ambulatory Visit (INDEPENDENT_AMBULATORY_CARE_PROVIDER_SITE_OTHER): Payer: BC Managed Care – PPO | Admitting: Orthopaedic Surgery

## 2020-08-18 ENCOUNTER — Encounter: Payer: Self-pay | Admitting: Orthopaedic Surgery

## 2020-08-18 VITALS — Ht 72.0 in | Wt 175.0 lb

## 2020-08-18 DIAGNOSIS — M545 Low back pain, unspecified: Secondary | ICD-10-CM

## 2020-08-18 MED ORDER — HYDROCODONE-ACETAMINOPHEN 5-325 MG PO TABS: 1.0000 | ORAL_TABLET | Freq: Four times a day (QID) | ORAL | 0 refills | Status: AC | PRN

## 2020-08-18 NOTE — Progress Notes (Signed)
Office Visit Note   Patient: Christopher Molina           Date of Birth: [birth year]           MRN: [id number] Visit Date: 08/18/2020              Requested by: No referring provider defined for this encounter. PCP: Patient, No Pcp Per   Assessment & Plan: Visit Diagnoses:  1. Low back pain, unspecified back pain laterality, unspecified chronicity, unspecified whether sciatica present     Plan: Patient's had previous x-rays showed no spondylolisthesis.  Has positive nerve root tension signs significant pain.  Norco renewed.  He needs an MRI scan with and without contrast lumbar rule out recurrent disc herniation with compression.  He did well after his initial procedure 2019 and had been working and still goes to work although he is in significant pain right now.  He did well until 3 months ago with onset of recurrent pain that he woke up with.  He is taken prednisone Dosepak muscle relaxants topical treatment, heating pad, ice.  Lumbar MRI with and without contrast office follow-up after scan for review.  Follow-Up Instructions: Return for lumbar MRI review.  Orders:  Orders Placed This Encounter  Procedures  . MR LUMBAR SPINE W WO CONTRAST   Meds ordered this encounter  Medications  . HYDROcodone-acetaminophen (NORCO/VICODIN) 5-325 MG tablet    Sig: Take 1-2 tablets by mouth every 6 (six) hours as needed for moderate pain.    Dispense:  30 tablet    Refill:  0      Procedures: No procedures performed   Clinical Data: No additional findings.   Subjective: Chief Complaint  Patient presents with  . Lower Back - Follow-up, Pain    HPI 34 year old male returns post left L5-S1 microdiscectomy 03/24/2018 who did well until 3 months ago when he woke up with severe back pain left leg pain which is not relented.  He states his pain is 10 out of 10 he has taken Vicodin he still goes into work but only takes pain medicine after work.  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  He still able to walk on his toes but has some mild left gastrocsoleus weakness.  Anterior tib is strong and symmetrical.  Absent ankle jerk positive straight leg raising 60 degrees on the left.  Patient states his pain is 10 out of 10 and he cannot stand it any longer.  Review of Systems negative for chills or fever no bowel bladder symptoms all other systems are negative.   Objective: Vital Signs: Ht 6' (1.829 m)   Wt 175 lb (79.4 kg)   BMI 23.73 kg/m   Physical Exam Constitutional:      Appearance: He is well-developed.     Comments: Patient unable to sit down but does better standing.  He is in obvious distress.  HENT:     Head: Normocephalic and atraumatic.  Eyes:     Pupils: Pupils are equal, round, and reactive to light.  Neck:     Thyroid: No thyromegaly.     Trachea: No tracheal deviation.  Cardiovascular:     Rate and Rhythm: Normal rate.  Pulmonary:     Effort: Pulmonary effort is normal.     Breath sounds: No wheezing.  Abdominal:  General: Bowel sounds are normal.     Palpations: Abdomen is soft.  Skin:    General: Skin is warm and dry.     Capillary Refill: Capillary refill takes less than 2 seconds.  Neurological:     Mental Status: He is alert and oriented to person, place, and time.  Psychiatric:        Behavior: Behavior normal.        Thought Content: Thought content normal.        Judgment: Judgment normal.     Ortho Exam plus sciatic notch tenderness well healed lumbar incision.  Positive straight leg raising on the left at 70 degrees.  Gastrocsoleus weakness with toe raise fatigue single stance.  Knee jerk 2+ absent left ankle jerk.  Specialty Comments:  No specialty comments available.  Imaging: No results found.   PMFS History: Patient Active Problem List   Diagnosis Date Noted  . Acute left-sided low back  pain 01/16/2018  . Alcohol dependence with uncomplicated withdrawal (HCC) 11/19/2014  . Opiate dependence (HCC) 11/19/2014   Past Medical History:  Diagnosis Date  . Alcoholism /alcohol abuse   . Asthma    as a child  . Hypertension     Family History  Problem Relation Age of Onset  . COPD Mother   . Alcohol abuse Father     Past Surgical History:  Procedure Laterality Date  . CLUB FOOT RELEASE     x 2  . LUMBAR LAMINECTOMY N/A 03/24/2018   Procedure: LEFT L5-S1 MICRODISCECTOMY;  Surgeon: Eldred Manges, MD;  Location: [facility] OR;  Service: Orthopedics;  Laterality: N/A;   Social History   Occupational History  . Occupation: Control and instrumentation engineer  Tobacco Use  . Smoking status: Current Every Day Smoker    Packs/day: 1.00    Years: 10.00    Pack years: 10.00    Types: Cigarettes  . Smokeless tobacco: Never Used  Vaping Use  . Vaping Use: Never used  Substance and Sexual Activity  . Alcohol use: Yes    Alcohol/week: 35.0 standard drinks    Types: 35 Cans of beer per week    Comment: occasional  . Drug use: Not Currently    Comment: MJ daily, percocet recreationally, 6-7 10mg  tabs a day  . Sexual activity: Not on file

## 2020-08-26 ENCOUNTER — Encounter: Payer: Self-pay | Admitting: Orthopaedic Surgery

## 2020-09-01 ENCOUNTER — Other Ambulatory Visit: Payer: Self-pay

## 2020-09-01 ENCOUNTER — Ambulatory Visit (INDEPENDENT_AMBULATORY_CARE_PROVIDER_SITE_OTHER): Payer: BC Managed Care – PPO | Admitting: Orthopaedic Surgery

## 2020-09-01 ENCOUNTER — Encounter: Payer: Self-pay | Admitting: Orthopaedic Surgery

## 2020-09-01 VITALS — Ht 72.0 in | Wt 175.0 lb

## 2020-09-01 DIAGNOSIS — M5126 Other intervertebral disc displacement, lumbar region: Secondary | ICD-10-CM

## 2020-09-01 DIAGNOSIS — M545 Low back pain, unspecified: Secondary | ICD-10-CM | POA: Diagnosis not present

## 2020-09-01 NOTE — Progress Notes (Signed)
Office Visit Note   Patient: Christopher Molina           Date of Birth: 1985/12/10           MRN: [id number] Visit Date: 09/01/2020              Requested by: No referring provider defined for this encounter. PCP: Patient, No Pcp Per   Assessment & Plan: Visit Diagnoses:  1. Acute left-sided low back pain, unspecified whether sciatica present   2. Recurrent displacement of lumbar disc     Plan: We reviewed the MRI scan.  Originally with the surgery 2019 he got great relief and was doing well up until 3 months ago.  His MRI scan does not show severe compression of the nerve and I like to try an epidural injection before we consider repeat microdiscectomy for his recurrent disc problem on the left at L5-S1.  Hopefully can settle down with the injection and we can avoid surgical intervention.  I plan to follow him up after the epidural.  MRI scan was discussed and images reviewed with patient.  We discussed other options including fusion however since he got great relief from microdiscectomy and with the adjacent level L4-5 showing disc desiccation protrusion and some foraminal narrowing he understands that single level fusion at 5 1 would load the L4-5 level which might significantly accelerated to wear rate.  We will say does with the epidural and follow-up after injection. Follow-Up Instructions: No follow-ups on file.   Orders:  Orders Placed This Encounter  Procedures  . Ambulatory referral to Physical Medicine Rehab   No orders of the defined types were placed in this encounter.     Procedures: No procedures performed   Clinical Data: No additional findings.   Subjective: Chief Complaint  Patient presents with  . Lower Back - Follow-up, Pain    MRI lumbar review    HPI 34 year old male returns with ongoing problems with his back with pain with standing or sitting.  He is continuing to work but does not have to do a lot of lifting.  Past history of alcohol and opioid  dependence in the distant past.  New MRI scan has been obtained and it shows lumbar spondylosis at L4-5 and L5-S1.  Levels above are normal.  Patient has broad-based disc bulge left paracentral which contacts the S1 nerve root but does not displace it.  There is moderate left and mild right neuroforaminal narrowing.  Broad-based disc bulge at L4-5 with mild foraminal narrowing.  Today's visit MRI scan was reviewed.  Patient's had a steroid Dosepak gave him temporary relief.  He is taken some Robaxin and also few Norco.  Care with pain medication with past history of dependency on opioids.  Review of Systems all other systems updated and unchanged.   Objective: Vital Signs: Ht 6' (1.829 m)   Wt 175 lb (79.4 kg)   BMI 23.73 kg/m   Physical Exam Constitutional:      Appearance: He is well-developed.  HENT:     Head: Normocephalic and atraumatic.  Eyes:     Pupils: Pupils are equal, round, and reactive to light.  Neck:     Thyroid: No thyromegaly.     Trachea: No tracheal deviation.  Cardiovascular:     Rate and Rhythm: Normal rate.  Pulmonary:     Effort: Pulmonary effort is normal.     Breath sounds: No wheezing.  Abdominal:     General: Bowel sounds are  normal.     Palpations: Abdomen is soft.  Skin:    General: Skin is warm and dry.     Capillary Refill: Capillary refill takes less than 2 seconds.  Neurological:     Mental Status: He is alert and oriented to person, place, and time.  Psychiatric:        Behavior: Behavior normal.        Thought Content: Thought content normal.        Judgment: Judgment normal.     Ortho Exam patient has some discomfort with straight leg raising on the left.  Negative on the right mild sciatic notch tenderness.  Well-healed incision L5 is 1 few millimeters from midline from 2019 surgery.  He is able to heel and toe walk.  Trace ankle jerk on the left 2+ on the right 2+ knee jerk right and left.  Peroneals are strong EHL is strong.  No anterior  tib weakness right or left.  Distal pulses are normal. Specialty Comments:  No specialty comments available.  Imaging: No results found.   PMFS History: Patient Active Problem List   Diagnosis Date Noted  . Recurrent displacement of lumbar disc 09/02/2020  . Acute left-sided low back pain 01/16/2018  . Alcohol dependence with uncomplicated withdrawal (HCC) 11/19/2014  . Opiate dependence (HCC) 11/19/2014   Past Medical History:  Diagnosis Date  . Alcoholism /alcohol abuse   . Asthma    as a child  . Hypertension     Family History  Problem Relation Age of Onset  . COPD Mother   . Alcohol abuse Father     Past Surgical History:  Procedure Laterality Date  . CLUB FOOT RELEASE     x 2  . LUMBAR LAMINECTOMY N/A 03/24/2018   Procedure: LEFT L5-S1 MICRODISCECTOMY;  Surgeon: Eldred Manges, MD;  Location: [facility] OR;  Service: Orthopedics;  Laterality: N/A;   Social History   Occupational History  . Occupation: Control and instrumentation engineer  Tobacco Use  . Smoking status: Current Every Day Smoker    Packs/day: 1.00    Years: 10.00    Pack years: 10.00    Types: Cigarettes  . Smokeless tobacco: Never Used  Vaping Use  . Vaping Use: Never used  Substance and Sexual Activity  . Alcohol use: Yes    Alcohol/week: 35.0 standard drinks    Types: 35 Cans of beer per week    Comment: occasional  . Drug use: Not Currently    Comment: MJ daily, percocet recreationally, 6-7 10mg  tabs a day  . Sexual activity: Not on file

## 2020-09-02 DIAGNOSIS — M5126 Other intervertebral disc displacement, lumbar region: Secondary | ICD-10-CM | POA: Insufficient documentation

## 2020-09-14 ENCOUNTER — Other Ambulatory Visit: Payer: Self-pay

## 2020-09-14 ENCOUNTER — Other Ambulatory Visit (HOSPITAL_COMMUNITY)
Admission: RE | Admit: 2020-09-14 | Discharge: 2020-09-14 | Disposition: A | Discharge status: Home / Self Care | Payer: BC Managed Care – PPO | Source: Ambulatory Visit | Attending: Family Medicine | Admitting: Family Medicine

## 2020-09-14 DIAGNOSIS — Z0000 Encounter for general adult medical examination without abnormal findings: Secondary | ICD-10-CM | POA: Insufficient documentation

## 2020-09-14 DIAGNOSIS — Z79899 Other long term (current) drug therapy: Secondary | ICD-10-CM | POA: Diagnosis not present

## 2020-09-14 LAB — CBC WITH DIFFERENTIAL/PLATELET
Abs Immature Granulocytes: 0.03 10*3/uL (ref 0.00–0.07)
Basophils Absolute: 0.1 10*3/uL (ref 0.0–0.1)
Basophils Relative: 1 %
Eosinophils Absolute: 0.1 10*3/uL (ref 0.0–0.5)
Eosinophils Relative: 1 %
HCT: 43.6 % (ref 39.0–52.0)
Hemoglobin: 14.2 g/dL (ref 13.0–17.0)
Immature Granulocytes: 0 %
Lymphocytes Relative: 28 %
Lymphs Abs: 3.3 10*3/uL (ref 0.7–4.0)
MCH: 31.1 pg (ref 26.0–34.0)
MCHC: 32.6 g/dL (ref 30.0–36.0)
MCV: 95.6 fL (ref 80.0–100.0)
Monocytes Absolute: 0.9 10*3/uL (ref 0.1–1.0)
Monocytes Relative: 8 %
Neutro Abs: 7.4 10*3/uL (ref 1.7–7.7)
Neutrophils Relative %: 62 %
Platelets: 242 10*3/uL (ref 150–400)
RBC: 4.56 MIL/uL (ref 4.22–5.81)
RDW: 12.4 % (ref 11.5–15.5)
WBC: 11.9 10*3/uL — ABNORMAL HIGH (ref 4.0–10.5)
nRBC: 0 % (ref 0.0–0.2)

## 2020-09-14 LAB — COMPREHENSIVE METABOLIC PANEL
ALT: 18 U/L (ref 0–44)
AST: 17 U/L (ref 15–41)
Albumin: 4 g/dL (ref 3.5–5.0)
Alkaline Phosphatase: 68 U/L (ref 38–126)
Anion gap: 8 (ref 5–15)
BUN: 9 mg/dL (ref 6–20)
CO2: 25 mmol/L (ref 22–32)
Calcium: 9.4 mg/dL (ref 8.9–10.3)
Chloride: 107 mmol/L (ref 98–111)
Creatinine, Ser: 0.81 mg/dL (ref 0.61–1.24)
GFR, Estimated: >60 mL/min (ref >60)
Glucose, Bld: 108 mg/dL — ABNORMAL HIGH (ref 70–99)
Potassium: 4 mmol/L (ref 3.5–5.1)
Sodium: 140 mmol/L (ref 135–145)
Total Bilirubin: 0.3 mg/dL (ref 0.3–1.2)
Total Protein: 7.1 g/dL (ref 6.5–8.1)

## 2020-09-14 LAB — LIPID PANEL
Cholesterol: 263 mg/dL — ABNORMAL HIGH (ref 0–200)
HDL: 36 mg/dL — ABNORMAL LOW (ref >40)
LDL Cholesterol: 168 mg/dL — ABNORMAL HIGH (ref 0–99)
Total CHOL/HDL Ratio: 7.3 RATIO
Triglycerides: 295 mg/dL — ABNORMAL HIGH (ref <150)
VLDL: 59 mg/dL — ABNORMAL HIGH (ref 0–40)

## 2020-09-14 LAB — TSH: TSH: 1.453 u[IU]/mL (ref 0.350–4.500)

## 2020-09-22 ENCOUNTER — Other Ambulatory Visit: Payer: Self-pay

## 2020-09-22 ENCOUNTER — Ambulatory Visit (INDEPENDENT_AMBULATORY_CARE_PROVIDER_SITE_OTHER): Payer: BC Managed Care – PPO | Admitting: Physical Medicine and Rehabilitation

## 2020-09-22 ENCOUNTER — Encounter: Payer: Self-pay | Admitting: Physical Medicine and Rehabilitation

## 2020-09-22 ENCOUNTER — Ambulatory Visit: Payer: Self-pay

## 2020-09-22 VITALS — BP 140/75 | HR 62

## 2020-09-22 DIAGNOSIS — M5116 Intervertebral disc disorders with radiculopathy, lumbar region: Secondary | ICD-10-CM

## 2020-09-22 DIAGNOSIS — M5416 Radiculopathy, lumbar region: Secondary | ICD-10-CM

## 2020-09-22 MED ORDER — METHYLPREDNISOLONE ACETATE 80 MG/ML IJ SUSP
80.0000 mg | Freq: Once | INTRAMUSCULAR | Status: AC
  Administered 2020-09-22: 80 mg

## 2020-09-22 NOTE — Progress Notes (Signed)
Left sided low back pain. No pain in leg recently.  Numeric Pain Rating Scale and Functional Assessment Average Pain 7   In the last MONTH (on 0-10 scale) has pain interfered with the following?  1. General activity like being  able to carry out your everyday physical activities such as walking, climbing stairs, carrying groceries, or moving a chair?  Rating(8)   +Driver, -BT, -Dye Allergies.

## 2020-09-22 NOTE — Procedures (Signed)
S1 Lumbosacral Transforaminal Epidural Steroid Injection - Sub-Pedicular Approach with Fluoroscopic Guidance   Patient: Christopher Molina      Date of Birth: 1986/09/22 MRN: [id number] PCP: Patient, No Pcp Per      Visit Date: 09/22/2020   Universal Protocol:    Date/Time: 12/02/211:02 PM  Consent Given By: the patient  Position:  PRONE  Additional Comments: Vital signs were monitored before and after the procedure. Patient was prepped and draped in the usual sterile fashion. The correct patient, procedure, and site was verified.   Injection Procedure Details:  Procedure Site One Meds Administered:  Meds ordered this encounter  Medications  . methylPREDNISolone acetate (DEPO-MEDROL) injection 80 mg    Laterality: Left  Location/Site:  S1 Foramen   Needle size: 22 ga.  Needle type: Spinal  Needle Placement: Transforaminal  Findings:   -Comments: Excellent flow of contrast along the nerve, nerve root and into the epidural space.  Procedure Details: After squaring off the sacral end-plate to get a true AP view, the C-arm was positioned so that the best possible view of the S1 foramen was visualized. The soft tissues overlying this structure were infiltrated with 2-3 ml. of 1% Lidocaine without Epinephrine.    The spinal needle was inserted toward the target using a "trajectory" view along the fluoroscope beam.  Under AP and lateral visualization, the needle was advanced so it did not puncture dura. Biplanar projections were used to confirm position. Aspiration was confirmed to be negative for CSF and/or blood. A 1-2 ml. volume of Isovue-250 was injected and flow of contrast was noted at each level. Radiographs were obtained for documentation purposes.   After attaining the desired flow of contrast documented above, a 0.5 to 1.0 ml test dose of 0.25% Marcaine was injected into each respective transforaminal space.  The patient was observed for 90 seconds post injection.   After no sensory deficits were reported, and normal lower extremity motor function was noted,   the above injectate was administered so that equal amounts of the injectate were placed at each foramen (level) into the transforaminal epidural space.   Additional Comments:  The patient tolerated the procedure well Dressing: Band-Aid with 2 x 2 sterile gauze    Post-procedure details: Patient was observed during the procedure. Post-procedure instructions were reviewed.  Patient left the clinic in stable condition.

## 2020-09-22 NOTE — Progress Notes (Signed)
KY RUMPLE - 34 y.o. male MRN [id number]  Date of birth: 09-09-1986  Office Visit Note: Visit Date: 09/22/2020 PCP: Patient, No Pcp Per Referred by: Eldred Manges, MD  Subjective: Chief Complaint  Patient presents with   Lower Back - Pain   HPI:  DHANI DANNEMILLER is a 34 y.o. male who comes in today at the request of Dr. Annell Greening for planned Left S1-2 Lumbar epidural steroid injection with fluoroscopic guidance.  The patient has failed conservative care including home exercise, medications, time and activity modification.  This injection will be diagnostic and hopefully therapeutic.  Please see requesting physician notes for further details and justification.  MRI reviewed with images and spine model.  MRI reviewed in the note below.   ROS Otherwise per HPI.  Assessment & Plan: Visit Diagnoses:    ICD-10-CM   1. Lumbar radiculopathy  M54.16 XR C-ARM NO REPORT    Epidural Steroid injection    methylPREDNISolone acetate (DEPO-MEDROL) injection 80 mg  2. Radiculopathy due to lumbar intervertebral disc disorder  M51.16 XR C-ARM NO REPORT    Epidural Steroid injection    methylPREDNISolone acetate (DEPO-MEDROL) injection 80 mg    Plan: No additional findings.   Meds & Orders:  Meds ordered this encounter  Medications   methylPREDNISolone acetate (DEPO-MEDROL) injection 80 mg    Orders Placed This Encounter  Procedures   XR C-ARM NO REPORT   Epidural Steroid injection    Follow-up: Return for visit to requesting physician as needed.   Procedures: No procedures performed  S1 Lumbosacral Transforaminal Epidural Steroid Injection - Sub-Pedicular Approach with Fluoroscopic Guidance   Patient: ERBY SANDERSON      Date of Birth: 15-Jun-1986 MRN: [id number] PCP: Patient, No Pcp Per      Visit Date: 09/22/2020   Universal Protocol:    Date/Time: 12/02/211:02 PM  Consent Given By: the patient  Position:  PRONE  Additional Comments: Vital signs were monitored  before and after the procedure. Patient was prepped and draped in the usual sterile fashion. The correct patient, procedure, and site was verified.   Injection Procedure Details:  Procedure Site One Meds Administered:  Meds ordered this encounter  Medications   methylPREDNISolone acetate (DEPO-MEDROL) injection 80 mg    Laterality: Left  Location/Site:  S1 Foramen   Needle size: 22 ga.  Needle type: Spinal  Needle Placement: Transforaminal  Findings:   -Comments: Excellent flow of contrast along the nerve, nerve root and into the epidural space.  Procedure Details: After squaring off the sacral end-plate to get a true AP view, the C-arm was positioned so that the best possible view of the S1 foramen was visualized. The soft tissues overlying this structure were infiltrated with 2-3 ml. of 1% Lidocaine without Epinephrine.    The spinal needle was inserted toward the target using a "trajectory" view along the fluoroscope beam.  Under AP and lateral visualization, the needle was advanced so it did not puncture dura. Biplanar projections were used to confirm position. Aspiration was confirmed to be negative for CSF and/or blood. A 1-2 ml. volume of Isovue-250 was injected and flow of contrast was noted at each level. Radiographs were obtained for documentation purposes.   After attaining the desired flow of contrast documented above, a 0.5 to 1.0 ml test dose of 0.25% Marcaine was injected into each respective transforaminal space.  The patient was observed for 90 seconds post injection.  After no sensory deficits were reported, and normal  lower extremity motor function was noted,   the above injectate was administered so that equal amounts of the injectate were placed at each foramen (level) into the transforaminal epidural space.   Additional Comments:  The patient tolerated the procedure well Dressing: Band-Aid with 2 x 2 sterile gauze    Post-procedure details: Patient  was observed during the procedure. Post-procedure instructions were reviewed.  Patient left the clinic in stable condition.     Clinical History: MRI LUMBAR SPINE WITHOUT CONTRAST  TECHNIQUE: Multiplanar, multisequence MR imaging of the lumbar spine was performed. No intravenous contrast was administered.  COMPARISON: None.  FINDINGS: Segmentation: There are 5 non-rib bearing lumbar type vertebral bodies with the last intervertebral disc space labeled as L5-S1.  Alignment: Normal  Vertebrae: The vertebral body heights are well maintained. No fracture, marrow edema,or pathologic marrow infiltration. There is been a prior left-sided hemilaminotomy at L5.  Conus medullaris and cauda equina: Conus extends to the L1 level. Conus and cauda equina appear normal.  Paraspinal and other soft tissues: The paraspinal soft tissues and visualized retroperitoneal structures are unremarkable. The sacroiliac joints are intact.  Disc levels:  T12-L1: No significant canal or neural foraminal narrowing.  L1-L2: No significant canal or neural foraminal narrowing.  L2-L3: No significant canal or neural foraminal narrowing.  L3-L4: There is a minimal broad-based disc bulge with ligamentum flavum hypertrophy which causes mild bilateral neural foraminal narrowing.  L4-L5: There is a broad-based disc bulge with a small central disc protrusion and ligamentum flavum hypertrophy which causes mild bilateral neural foraminal narrowing and mild central canal stenosis.  L5-S1: There is a broad-based disc bulge with a left paracentral disc protrusion which contacts the descending left S1 nerve root. There is moderate left and mild right neural foraminal narrowing present.  IMPRESSION: Lumbar spine spondylosis most notable at L5-S1 with a left paracentral disc protrusion contacting the descending left S1 nerve root.   Electronically Signed By: Jonna Clark M.D. On: 08/26/2020 23:25      Objective:  VS:  HT:     WT:    BMI:      BP:140/75   HR:62bpm   TEMP: ( )   RESP:  Physical Exam Constitutional:      General: He is not in acute distress.    Appearance: Normal appearance. He is not ill-appearing.  HENT:     Head: Normocephalic and atraumatic.     Right Ear: External ear normal.     Left Ear: External ear normal.  Eyes:     Extraocular Movements: Extraocular movements intact.  Cardiovascular:     Rate and Rhythm: Normal rate.     Pulses: Normal pulses.  Abdominal:     General: There is no distension.     Palpations: Abdomen is soft.  Musculoskeletal:        General: No tenderness or signs of injury.     Right lower leg: No edema.     Left lower leg: No edema.     Comments: Patient has good distal strength without clonus.  Skin:    Findings: No erythema or rash.  Neurological:     General: No focal deficit present.     Mental Status: He is alert and oriented to person, place, and time.     Sensory: No sensory deficit.     Motor: No weakness or abnormal muscle tone.     Coordination: Coordination normal.  Psychiatric:        Mood and Affect: Mood normal.  Behavior: Behavior normal.      Imaging: No results found.

## 2021-07-09 ENCOUNTER — Emergency Department (HOSPITAL_COMMUNITY): Payer: BC Managed Care – PPO

## 2021-07-09 ENCOUNTER — Encounter (HOSPITAL_COMMUNITY): Payer: Self-pay

## 2021-07-09 ENCOUNTER — Emergency Department (HOSPITAL_COMMUNITY)
Admission: EM | Admit: 2021-07-09 | Discharge: 2021-07-09 | Disposition: A | Discharge status: Home / Self Care | Payer: BC Managed Care – PPO | Attending: Emergency Medicine | Admitting: Emergency Medicine

## 2021-07-09 DIAGNOSIS — Z79899 Other long term (current) drug therapy: Secondary | ICD-10-CM | POA: Insufficient documentation

## 2021-07-09 DIAGNOSIS — J45909 Unspecified asthma, uncomplicated: Secondary | ICD-10-CM | POA: Insufficient documentation

## 2021-07-09 DIAGNOSIS — R1011 Right upper quadrant pain: Secondary | ICD-10-CM | POA: Diagnosis not present

## 2021-07-09 DIAGNOSIS — F17210 Nicotine dependence, cigarettes, uncomplicated: Secondary | ICD-10-CM | POA: Insufficient documentation

## 2021-07-09 DIAGNOSIS — I10 Essential (primary) hypertension: Secondary | ICD-10-CM | POA: Diagnosis not present

## 2021-07-09 LAB — CBC WITH DIFFERENTIAL/PLATELET
Abs Immature Granulocytes: 0.02 10*3/uL (ref 0.00–0.07)
Basophils Absolute: 0.1 10*3/uL (ref 0.0–0.1)
Basophils Relative: 1 %
Eosinophils Absolute: 0.1 10*3/uL (ref 0.0–0.5)
Eosinophils Relative: 1 %
HCT: 39.9 % (ref 39.0–52.0)
Hemoglobin: 13.2 g/dL (ref 13.0–17.0)
Immature Granulocytes: 0 %
Lymphocytes Relative: 24 %
Lymphs Abs: 1.7 10*3/uL (ref 0.7–4.0)
MCH: 31.4 pg (ref 26.0–34.0)
MCHC: 33.1 g/dL (ref 30.0–36.0)
MCV: 95 fL (ref 80.0–100.0)
Monocytes Absolute: 0.7 10*3/uL (ref 0.1–1.0)
Monocytes Relative: 10 %
Neutro Abs: 4.6 10*3/uL (ref 1.7–7.7)
Neutrophils Relative %: 64 %
Platelets: 175 10*3/uL (ref 150–400)
RBC: 4.2 MIL/uL — ABNORMAL LOW (ref 4.22–5.81)
RDW: 12.7 % (ref 11.5–15.5)
WBC: 7.2 10*3/uL (ref 4.0–10.5)
nRBC: 0 % (ref 0.0–0.2)

## 2021-07-09 LAB — COMPREHENSIVE METABOLIC PANEL
ALT: 16 U/L (ref 0–44)
AST: 20 U/L (ref 15–41)
Albumin: 4 g/dL (ref 3.5–5.0)
Alkaline Phosphatase: 68 U/L (ref 38–126)
Anion gap: 3 — ABNORMAL LOW (ref 5–15)
BUN: 10 mg/dL (ref 6–20)
CO2: 26 mmol/L (ref 22–32)
Calcium: 8.7 mg/dL — ABNORMAL LOW (ref 8.9–10.3)
Chloride: 109 mmol/L (ref 98–111)
Creatinine, Ser: 0.88 mg/dL (ref 0.61–1.24)
GFR, Estimated: >60 mL/min (ref >60)
Glucose, Bld: 111 mg/dL — ABNORMAL HIGH (ref 70–99)
Potassium: 3.5 mmol/L (ref 3.5–5.1)
Sodium: 138 mmol/L (ref 135–145)
Total Bilirubin: 0.5 mg/dL (ref 0.3–1.2)
Total Protein: 6.7 g/dL (ref 6.5–8.1)

## 2021-07-09 LAB — URINALYSIS, ROUTINE W REFLEX MICROSCOPIC
Bilirubin Urine: NEGATIVE
Glucose, UA: NEGATIVE mg/dL
Hgb urine dipstick: NEGATIVE
Ketones, ur: NEGATIVE mg/dL
Leukocytes,Ua: NEGATIVE
Nitrite: NEGATIVE
Protein, ur: NEGATIVE mg/dL
Specific Gravity, Urine: 1.003 — ABNORMAL LOW (ref 1.005–1.030)
pH: 7 (ref 5.0–8.0)

## 2021-07-09 LAB — LIPASE, BLOOD: Lipase: 95 U/L — ABNORMAL HIGH (ref 11–51)

## 2021-07-09 MED ORDER — IBUPROFEN 400 MG PO TABS: 400.0000 mg | ORAL_TABLET | Freq: Three times a day (TID) | ORAL | 0 refills | Status: AC

## 2021-07-09 MED ORDER — OXYCODONE-ACETAMINOPHEN 5-325 MG PO TABS
1.0000 | ORAL_TABLET | Freq: Once | ORAL | Status: AC
  Administered 2021-07-09: 1 via ORAL
  Filled 2021-07-09: qty 1

## 2021-07-09 NOTE — Discharge Instructions (Signed)
Lab work and imaging are all unremarkable.  I have started you on ibuprofen please take as prescribed.  Recommend following up with your PCP for further evaluation.  Come back to the emergency department if you develop chest pain, shortness of breath, severe abdominal pain, uncontrolled nausea, vomiting, diarrhea.

## 2021-07-09 NOTE — ED Provider Notes (Signed)
[facility] Provider Note   CSN: [id number] Arrival date & time: 07/09/21  1252     History Chief Complaint  Patient presents with   Flank Pain    Christopher Molina is a 35 y.o. male.  HPI  Patient with significant medical history of alcoholism, asthma, hypertension presents to the emergency department with chief complaint of right upper abdominal tenderness.  Patient states that it started yesterday, came on suddenly while he was driving, describes it as a stabbing-like sensation which radiates slightly into his ribs and right flank, it is worsened with respiration and movement, he has no associated stomach pain, nausea, vomiting, diarrhea, he denies urinary symptoms, he has no history of kidney stones, stomach history, denies PUD, recent alcohol use or NSAID use.  He denies alleviating factors at this time.  He has no other complaints.  He does not endorse fevers, chills, nasal congestion, sore throat, cough, chest pain, general body aches worsening pedal edema.  Past Medical History:  Diagnosis Date   Alcoholism /alcohol abuse    Asthma    as a child   Hypertension     Patient Active Problem List   Diagnosis Date Noted   Recurrent displacement of lumbar disc 09/02/2020   Acute left-sided low back pain 01/16/2018   Alcohol dependence with uncomplicated withdrawal ([place]) 11/19/2014   Opiate dependence ([place]) 11/19/2014    Past Surgical History:  Procedure Laterality Date   CLUB FOOT RELEASE     x 2   LUMBAR LAMINECTOMY N/A 03/24/2018   Procedure: LEFT L5-S1 MICRODISCECTOMY;  Surgeon: Marybelle Killings, MD;  Location: [facility];  Service: Orthopedics;  Laterality: N/A;       Family History  Problem Relation Age of Onset   COPD Mother    Alcohol abuse Father     Social History   Tobacco Use   Smoking status: Every Day    Packs/day: 1.00    Years: 10.00    Pack years: 10.00    Types: Cigarettes   Smokeless tobacco: Never  Vaping Use   Vaping Use:  Never used  Substance Use Topics   Alcohol use: Yes    Alcohol/week: 35.0 standard drinks    Types: 35 Cans of beer per week    Comment: occasional   Drug use: Not Currently    Comment: MJ daily, percocet recreationally, 6-7 63m tabs a day    Home Medications Prior to Admission medications   Medication Sig Start Date End Date Taking? Authorizing Provider  ibuprofen (ADVIL) 400 MG tablet Take 1 tablet (400 mg total) by mouth 3 (three) times daily. 07/09/21  Yes FMarcello Fennel PA-C  simethicone (MYLICON) 80 MG chewable tablet Chew 80 mg by mouth every 6 (six) hours as needed for flatulence.   Yes [provider]  HYDROcodone-acetaminophen (NORCO/VICODIN) 5-325 MG tablet Take 1-2 tablets by mouth every 6 (six) hours as needed for moderate pain. Patient not taking: Reported on 07/09/2021 08/18/20   YMarybelle Killings MD  methocarbamol (ROBAXIN) 500 MG tablet Take 1 tablet (500 mg total) by mouth every 6 (six) hours as needed for muscle spasms. Patient not taking: Reported on 07/09/2021 03/24/18   OLanae Crumbly PA-C    Allergies    Patient has no known allergies.  Review of Systems   Review of Systems  Constitutional:  Negative for chills and fever.  HENT:  Negative for congestion.   Respiratory:  Negative for shortness of breath.   Cardiovascular:  Negative for chest pain.  Gastrointestinal:  Negative for abdominal pain, diarrhea, nausea and vomiting.  Genitourinary:  Positive for flank pain. Negative for dysuria and enuresis.  Musculoskeletal:  Negative for back pain.  Skin:  Negative for rash.  Neurological:  Negative for dizziness and headaches.  Hematological:  Does not bruise/bleed easily.   Physical Exam Updated Vital Signs BP (!) 146/92   Pulse (!) 43   Temp 98.8 F (37.1 C) (Oral)   Resp 17   Ht 6' (1.829 m)   Wt 79.8 kg   SpO2 100%   BMI 23.87 kg/m   Physical Exam Vitals and nursing note reviewed.  Constitutional:      General: He is not in acute  distress.    Appearance: He is not ill-appearing.  HENT:     Head: Normocephalic and atraumatic.     Nose: No congestion.  Eyes:     Conjunctiva/sclera: Conjunctivae normal.  Cardiovascular:     Rate and Rhythm: Normal rate and regular rhythm.     Pulses: Normal pulses.     Heart sounds: No murmur heard.   No friction rub. No gallop.  Pulmonary:     Effort: No respiratory distress.     Breath sounds: No wheezing, rhonchi or rales.  Chest:     Chest wall: No tenderness.  Abdominal:     Palpations: Abdomen is soft.     Tenderness: There is abdominal tenderness. There is no right CVA tenderness or left CVA tenderness.     Comments: Abdomen nondistended normal bowel sounds, dull to percussion, he had noted slight tenderness in his right upper quadrants no guarding, rebound tenderness, peritoneal sign, negative Murphy sign McBurney point, no CVA tenderness.  Musculoskeletal:     Right lower leg: No edema.     Left lower leg: No edema.     Comments: Spine was palpated was nontender to palpation.  Skin:    General: Skin is warm and dry.  Neurological:     Mental Status: He is alert.  Psychiatric:        Mood and Affect: Mood normal.    ED Results / Procedures / Treatments   Labs (all labs ordered are listed, but only abnormal results are displayed) Labs Reviewed  COMPREHENSIVE METABOLIC PANEL - Abnormal; Notable for the following components:      Result Value   Glucose, Bld 111 (*)    Calcium 8.7 (*)    Anion gap 3 (*)    All other components within normal limits  CBC WITH DIFFERENTIAL/PLATELET - Abnormal; Notable for the following components:   RBC 4.20 (*)    All other components within normal limits  LIPASE, BLOOD - Abnormal; Notable for the following components:   Lipase 95 (*)    All other components within normal limits  URINALYSIS, ROUTINE W REFLEX MICROSCOPIC - Abnormal; Notable for the following components:   Color, Urine STRAW (*)    Specific Gravity, Urine 1.003  (*)    All other components within normal limits    EKG EKG Interpretation  Date/Time:  Sunday July 09 2021 13:00:09 EDT Ventricular Rate:  71 PR Interval:  151 QRS Duration: 88 QT Interval:  389 QTC Calculation: 423 R Axis:   79 Text Interpretation: Sinus rhythm Probable left atrial enlargement No significant change since last tracing Confirmed by Dorie Rank [id number]) on 07/09/2021 1:25:37 PM  Radiology DG Chest 2 View  Result Date: 07/09/2021 CLINICAL DATA:  Chest pain EXAM: CHEST - 2 VIEW  COMPARISON:  Chest radiograph 08/03/2015 FINDINGS: The cardiomediastinal silhouette is within normal limits. The lungs are hyperinflated with flattening of the diaphragms and increased retrosternal clear space consistent with underlying COPD. There is no focal consolidation or pulmonary edema. There is no pleural effusion or pneumothorax. There is no acute osseous abnormality. IMPRESSION: Hyperinflation of the lungs suggesting underlying COPD. Otherwise, no radiographic evidence of acute cardiopulmonary process. Electronically Signed   By: Valetta Mole M.D.   On: 07/09/2021 15:11   CT Renal Stone Study  Result Date: 07/09/2021 CLINICAL DATA:  Right flank pain since yesterday EXAM: CT ABDOMEN AND PELVIS WITHOUT CONTRAST TECHNIQUE: Multidetector CT imaging of the abdomen and pelvis was performed following the standard protocol without IV contrast. COMPARISON:  None. FINDINGS: Lower chest: No acute abnormality. Hepatobiliary: No solid liver abnormality is seen. No gallstones, gallbladder wall thickening, or biliary dilatation. Pancreas: Unremarkable. No pancreatic ductal dilatation or surrounding inflammatory changes. Spleen: Normal in size without significant abnormality. Adrenals/Urinary Tract: Adrenal glands are unremarkable. Kidneys are normal, without renal calculi, solid lesion, or hydronephrosis. Bladder is unremarkable. Stomach/Bowel: Stomach is within normal limits. Appendix appears normal. No  evidence of bowel wall thickening, distention, or inflammatory changes. Scattered cecal diverticula. Vascular/Lymphatic: No significant vascular findings are present. No enlarged abdominal or pelvic lymph nodes. Reproductive: No mass or other significant abnormality. Other: No abdominal wall hernia or abnormality. No abdominopelvic ascites. Musculoskeletal: No acute or significant osseous findings. IMPRESSION: 1. No non-contrast CT findings of the abdomen or pelvis to explain right flank pain. No evidence of urinary tract calculus or hydronephrosis. 2. Scattered cecal diverticula without evidence of acute diverticulitis. Electronically Signed   By: Eddie Candle M.D.   On: 07/09/2021 16:10    Procedures Procedures   Medications Ordered in ED Medications  oxyCODONE-acetaminophen (PERCOCET/ROXICET) 5-325 MG per tablet 1 tablet (1 tablet Oral Given 07/09/21 1515)    ED Course  I have reviewed the triage vital signs and the nursing notes.  Pertinent labs & imaging results that were available during my care of the patient were reviewed by me and considered in my medical decision making (see chart for details).    MDM Rules/Calculators/A&P                          Initial impression-patient presents with right upper quadrant pain.  He is alert, does not appear in acute stress, vital signs reassuring.  Unclear etiology costochondritis versus muscular strain versus pneumothorax versus liver/gallbladder normality will obtain basic lab work-up, provide patient pain medications and reassess.  Work-up-CBC unremarkable, CMP shows hyperglycemia 111, calcium 8.7, anion gap 3, UA negative for nitrates, leukocytes, hematuria, lipase is slightly elevated 95.  Chest x-ray reveals hyperinflated lungs without signs of infection.  CT renal negative for acute findings.  Reassessment-patient was assessed after pain medication, states he has no complaints, vital signs remained stable.  States he has some slight  tenderness will proceed with CT renal to exclude possibility of a kidney stone.  Patient is up-to-date on results for surgery and stable he is in agreement for discharge.  Rule out-I have low suspicion for UTI, [place], kidney stone as urine is unremarkable, as CT renal negative for acute findings.  I have low suspicion for liver or gallbladder abnormality as she has no elevation liver enzymes, alk phos, T bili he has negative Murphy sign.  Best imaging modality for exclusion of a cholecystitis, choledocholithiasis, cholangitis would be ultrasound but without any GI symptoms  i.e. nausea, vomiting, worsening with eating or drinking I have very low suspicion of this.  I have low suspicion for pancreatitis as he has no risk factors, lipase is only mildly elevated at 95 is not 3 times above the normal limits.  No suspicion for bowel obstruction abdomen is nondistended patient still passing gas having bowel movements no acute finding on CT scan.  Low suspicion for complicated diverticulitis as there is no gas noted within the CT abdomen pelvis, he has no white count, vital signs reassuring.  Low suspicion for pneumothorax and/or pneumonia as chest x-ray is negative for acute findings.  Plan-  Right upper quadrant pain/lower right rib pain-unclear etiology likely this is costochondritis will recommend NSAIDs, follow-up with PCP for further evaluation.  Vital signs have remained stable, no indication for hospital admission.    Patient given at home care as well strict return precautions.  Patient verbalized that they understood agreed to said plan.  Final Clinical Impression(s) / ED Diagnoses Final diagnoses:  RUQ abdominal pain    Rx / DC Orders ED Discharge Orders          Ordered    ibuprofen (ADVIL) 400 MG tablet  3 times daily        07/09/21 1718             Marcello Fennel, PA-C 07/09/21 1719    Dorie Rank, MD 07/10/21 [phone]

## 2021-07-09 NOTE — ED Triage Notes (Signed)
Pt. Arrived via EMS with complaints of right flank pain that started yesterday around 6:30 pm.

## 2021-08-28 ENCOUNTER — Ambulatory Visit (INDEPENDENT_AMBULATORY_CARE_PROVIDER_SITE_OTHER): Payer: BC Managed Care – PPO | Admitting: Urology

## 2021-08-28 ENCOUNTER — Other Ambulatory Visit: Payer: Self-pay

## 2021-08-28 VITALS — BP 157/76 | HR 80

## 2021-08-28 DIAGNOSIS — Z3009 Encounter for other general counseling and advice on contraception: Secondary | ICD-10-CM

## 2021-08-28 MED ORDER — DIAZEPAM 10 MG PO TABS: 10.0000 mg | ORAL_TABLET | Freq: Once | ORAL | 0 refills | Status: AC

## 2021-08-28 NOTE — Progress Notes (Signed)

## 2021-08-28 NOTE — Progress Notes (Signed)
08/28/2021 1:41 PM   Christopher Molina 1985-10-30 [id number]  Referring provider: Ignatius Specking, MD [address] [place],  [place] [address]  No chief complaint on file.   HPI: He is here to consider vasectomy. He is married and has one child with his wife and two other biological children. Kids are healthy. They have considered vasectomy for about 5 years. She is using IUD and has some side effects. No scrotal or inguinal surgery.   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   PMH: Past Medical History:  Diagnosis Date   Alcoholism /alcohol abuse    Asthma    as a child   Hypertension     Surgical History: Past Surgical History:  Procedure Laterality Date   CLUB FOOT RELEASE     x 2   LUMBAR LAMINECTOMY N/A 03/24/2018   Procedure: LEFT L5-S1 MICRODISCECTOMY;  Surgeon: Eldred Manges, MD;  Location[facility];  Service: Orthopedics;  Laterality: N/A;    Home Medications:  Allergies as of 08/28/2021   No Known Allergies      Medication List        Accurate as of August 28, 2021  1:41 PM. If you have any questions, ask your nurse or doctor.          HYDROcodone-acetaminophen 5-325 MG tablet Commonly known as: NORCO/VICODIN Take 1-2 tablets by mouth every 6 (six) hours as needed for moderate pain.   ibuprofen 400 MG tablet Commonly known as: ADVIL Take 1 tablet (400 mg total) by mouth 3 (three) times daily.   methocarbamol 500 MG tablet Commonly known as: Robaxin Take 1 tablet (500 mg total) by mouth every 6 (six) hours as needed for muscle spasms.   simethicone 80 MG chewable tablet Commonly known as: MYLICON Chew 80 mg by mouth every 6 (six) hours as needed for flatulence.        Allergies: No Known Allergies  Family History: Family History  Problem Relation Age of Onset   COPD Mother    Alcohol abuse Father     Social History:  reports that he has been smoking cigarettes. He has a 10.00 pack-year smoking history. He has never used smokeless  tobacco. He reports current alcohol use of about 35.0 standard drinks per week. He reports that he does not currently use drugs.   Physical Exam: BP (!) 157/76   Pulse 80   Constitutional:  Alert and oriented, No acute distress. HEENT: [place] AT, moist mucus membranes.  Trachea midline, no masses. Cardiovascular: No clubbing, cyanosis, or edema. Respiratory: Normal respiratory effort, no increased work of breathing. GI: Abdomen is soft, nontender, nondistended, no abdominal masses Skin: No rashes, bruises or suspicious lesions. Neurologic: Grossly intact, no focal deficits, moving all 4 extremities. Psychiatric: Normal mood and affect. GU: Penis circumcised, normal foreskin, testicles descended bilaterally and palpably normal, bilateral epididymis palpably normal, scrotum normal   Laboratory Data: Lab Results  Component Value Date   WBC 7.2 07/09/2021   HGB 13.2 07/09/2021   HCT 39.9 07/09/2021   MCV 95.0 07/09/2021   PLT 175 07/09/2021    Lab Results  Component Value Date   CREATININE 0.88 07/09/2021    No results found for: PSA  No results found for: TESTOSTERONE  No results found for: HGBA1C  Urinalysis    Component Value Date/Time   COLORURINE STRAW (A) 07/09/2021 1517   APPEARANCEUR CLEAR 07/09/2021 1517   APPEARANCEUR Clear 11/20/2014 0044   LABSPEC 1.003 (L)  07/09/2021 1517   LABSPEC 1.004 11/20/2014 0044   PHURINE 7.0 07/09/2021 1517   GLUCOSEU NEGATIVE 07/09/2021 1517   GLUCOSEU Negative 11/20/2014 0044   HGBUR NEGATIVE 07/09/2021 1517   BILIRUBINUR NEGATIVE 07/09/2021 1517   BILIRUBINUR Negative 11/20/2014 0044   KETONESUR NEGATIVE 07/09/2021 1517   PROTEINUR NEGATIVE 07/09/2021 1517   NITRITE NEGATIVE 07/09/2021 1517   LEUKOCYTESUR NEGATIVE 07/09/2021 1517   LEUKOCYTESUR Negative 11/20/2014 0044    Lab Results  Component Value Date   BACTERIA NONE SEEN 11/20/2014      Results for orders placed during the hospital encounter of 07/09/21  CT  Renal Stone Study  Narrative CLINICAL DATA:  Right flank pain since yesterday  EXAM: CT ABDOMEN AND PELVIS WITHOUT CONTRAST  TECHNIQUE: Multidetector CT imaging of the abdomen and pelvis was performed following the standard protocol without IV contrast.  COMPARISON:  None.  FINDINGS: Lower chest: No acute abnormality.  Hepatobiliary: No solid liver abnormality is seen. No gallstones, gallbladder wall thickening, or biliary dilatation.  Pancreas: Unremarkable. No pancreatic ductal dilatation or surrounding inflammatory changes.  Spleen: Normal in size without significant abnormality.  Adrenals/Urinary Tract: Adrenal glands are unremarkable. Kidneys are normal, without renal calculi, solid lesion, or hydronephrosis. Bladder is unremarkable.  Stomach/Bowel: Stomach is within normal limits. Appendix appears normal. No evidence of bowel wall thickening, distention, or inflammatory changes. Scattered cecal diverticula.  Vascular/Lymphatic: No significant vascular findings are present. No enlarged abdominal or pelvic lymph nodes.  Reproductive: No mass or other significant abnormality.  Other: No abdominal wall hernia or abnormality. No abdominopelvic ascites.  Musculoskeletal: No acute or significant osseous findings.  IMPRESSION: 1. No non-contrast CT findings of the abdomen or pelvis to explain right flank pain. No evidence of urinary tract calculus or hydronephrosis. 2. Scattered cecal diverticula without evidence of acute diverticulitis.   Electronically Signed By: Lauralyn Primes M.D. On: 07/09/2021 16:10   Assessment & Plan:    1. Vasectomy evaluation I discussed with the patient normal male genitourinary anatomy and passage of sperm. We discussed the nature of vasectomy and that vasectomy is intended to be a permanent form of contraception. Although options do exist for fertility after vasectomy they are not always successful and can be quite expensive. We  discussed vasectomy does not produce immediate sterility and another  form of contraception is required until a postvasectomy semen analysis confirms no sperm. We discussed this can take several weeks to months. We discussed that vasectomy is not 100% successful with the risk of pregnancy approximately 1 in 2000 following the procedure which may be due to late failure from rejoining of the vas ends.  Rarely repeat vasectomy is required. We discussed risks such as bleeding, infection, testicular atrophy, sperm granuloma, acute pain and permanent chronic scrotal pain among others. We discussed there are other permanent and nonpermanent alternatives to vasectomy including male sterilization. We discussed postop care and that no heavy lifting, ejaculation or strenuous exercise is recommended for one week. All questions answered.     No follow-ups on file.  Jerilee Field, MD  [facility]  [address] [place], [place] [address] [phone]

## 2021-10-09 ENCOUNTER — Encounter: Payer: Self-pay | Admitting: Urology

## 2021-10-09 ENCOUNTER — Ambulatory Visit (INDEPENDENT_AMBULATORY_CARE_PROVIDER_SITE_OTHER): Payer: BC Managed Care – PPO | Admitting: Urology

## 2021-10-09 VITALS — BP 158/61 | HR 89 | Wt 176.0 lb

## 2021-10-09 DIAGNOSIS — Z9852 Vasectomy status: Secondary | ICD-10-CM

## 2021-10-09 DIAGNOSIS — Z302 Encounter for sterilization: Secondary | ICD-10-CM

## 2021-10-09 NOTE — Patient Instructions (Signed)
Vasectomy Postoperative Instructions  Please bring back a semen analysis in approximately 3 months.  Your semen analysis  will need to be taken to  [facility] [address] [place], [place] [address] There phone number is [phone]. Please call and schedule an appointment before taking your specimen.    You will be given a sterile specimen cup. Please label the cup with your name, date of birth, date and time of collections.  What to Expect  - slight redness, swelling and scant drainage along the incision  - mild to moderate discomfort  - black and blue (bruising) as the tissue heals  - low grade fever  - scrotal sensitivity and/or tenderness - Edges of the incision may pull apart and heal slowly, sometimes a knot may be present which remains for several months.  This is NORMAL and all part of the healing process. - if stitches are placed, they do not need to be removed - if you have pain or discomfort immediately after the vasectomy, you may use OTC pain medication for relief , ex: tylenol.  After local anesthetic wears off an ice pack will provide additional comfort and can also prevent swelling if used  Activity  - no sexual intercourse for at lease 5 days depending on comfort  - no heavy lifting for 48-72 hours (anything over 5-10 lbs)  Wound Care  - shower only after 24 hours  - no tub baths, hot tub, or pools for at least 7 days  - ice packs for 48 hours: 30 minutes on and 30 minutes off  Problem to Report  - generalized redness  - increased pain and swelling  - fever greater than 101 F  - significant drainage or bleeding from the wound  TO DO - Ejaculations help to clear the passage of sperm, but you must use another from of birth control until you are told you may discontinue its use!! - You will be given a specimen cup to bring back a semen sample in 3 months to check and see if its clear of sperm.  Only after the semen is sent for analysis and is reported back as clear  should you use this as your primary form of birth control!   

## 2021-10-09 NOTE — Progress Notes (Signed)
Devron returns for vasectomy.  He does not wish to have anymore kids.  He does not have any additional questions.  We did discuss the permanent nature of vasectomy again.  We also discussed vasectomy does not work immediately and he needs to continue other birth control until his semen analysis shows no sperm which might take a few weeks to a few months.  The patient was prepped and draped in the usual sterile fashion. A timeout was performed to confirm the patient and procedure. The skin and left vas was grasped and infiltrated with local. A puncture hole was made with a sharp hemostat and the vas was grasped with a ring Clamp and And brought out through the incision. The sharp hemostat was used to clear the vasal fascia and once clear a 1 cm segment of the vas was excised and removed.The needlepoint Bovie was placed in the proximal and distal lumens and cautery was performed. The proximal and distal lumens were buried within the sheath with a 3-0 silk suture providing good fascial interposition. Hemostasis was excellent. The right vas was isolated and through the same puncture whole, was infiltrated with local, a segment excised, the lumens cauterized, and both ends buried under the sheath. Hemostasis was excellent. The puncture hole was closed with a single horizontal mattress suture. The patient tolerated the procedure well. He was cleaned and a dressing applied. Discussed again post-op restrictions.

## 2022-01-03 LAB — POST VAS SEMEN ANALYSIS, AUA: Volume: 3.9 mL

## 2022-01-08 ENCOUNTER — Telehealth: Payer: Self-pay

## 2022-01-08 NOTE — Telephone Encounter (Signed)
Patient called with no answer. Detailed message left. °

## 2022-01-08 NOTE — Telephone Encounter (Signed)
-----   Message from Jerilee Field, MD sent at 01/06/2022 11:57 AM EDT ----- ?These let [place] know his semen analysis showed no sperm.  He can rely on the vasectomy and stop other birth control.  Thank you. ? ?----- Message ----- ?From: Christopher Messing, LPN ?Sent: 01/03/2022   8:22 AM EDT ?To: Jerilee Field, MD ? ?Please review ? ?

## 2022-04-26 IMAGING — CT CT RENAL STONE PROTOCOL
2 of 4 series · 16 of 46 positions shown, 18 images · non-contrast
Comparison: None.

CLINICAL DATA: Right flank pain since yesterday

EXAM:
CT ABDOMEN AND PELVIS WITHOUT CONTRAST
TECHNIQUE: Multidetector CT imaging of the abdomen and pelvis was performed
following the standard protocol without IV contrast.

[Series 2: axial st · axial · 0.94mm/px · z∈[+422,+846]mm · 13 of 97 slices shown, 15 images]
[im 6/97  soft-tissue]
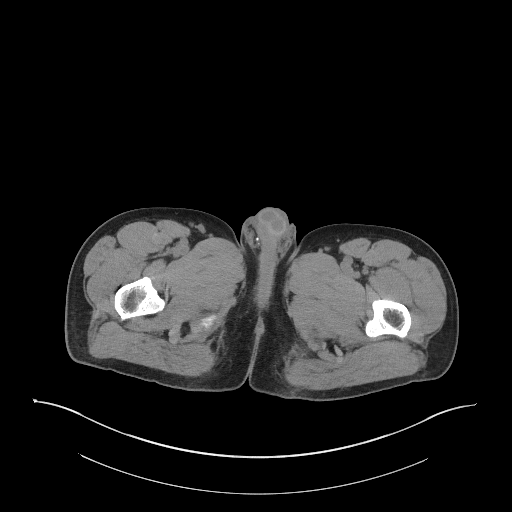
[im 6/97  bone]
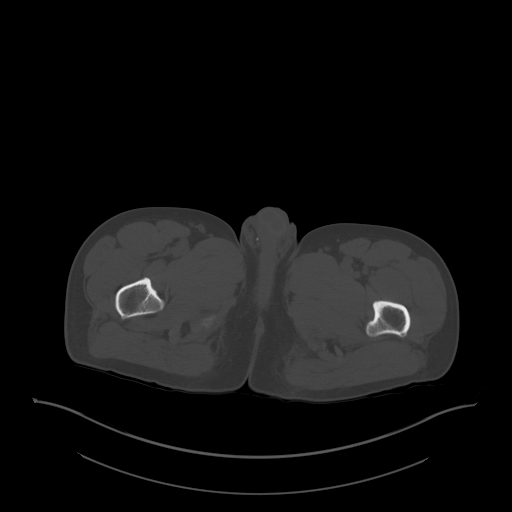
[im 12/97  soft-tissue]
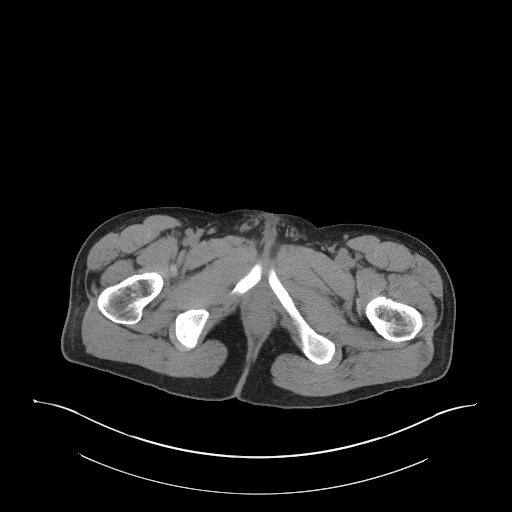
[im 23/97  soft-tissue]
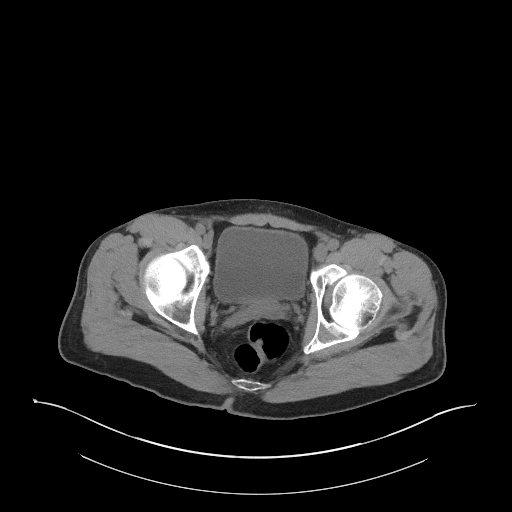
[im 29/97  soft-tissue]
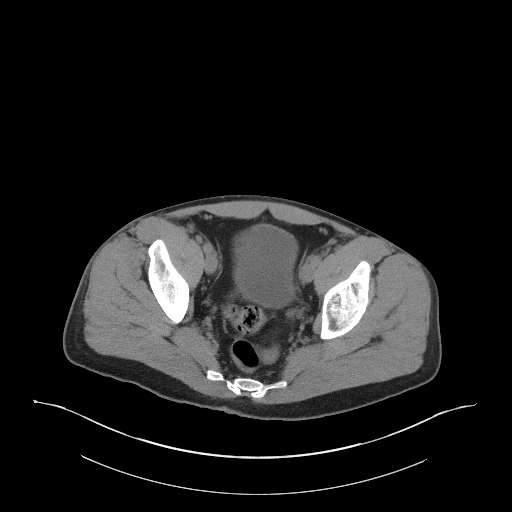
[im 34/97  soft-tissue]
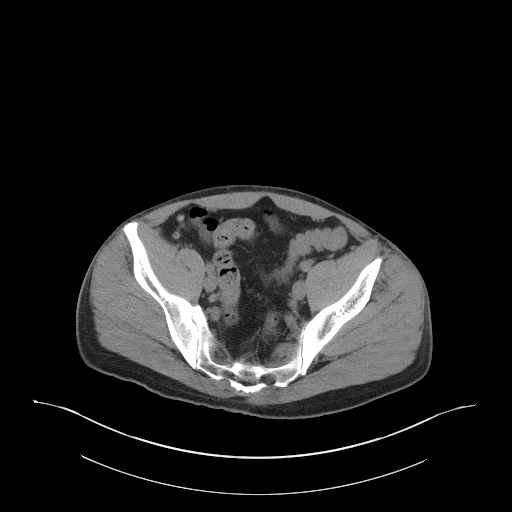
[im 40/97  soft-tissue]
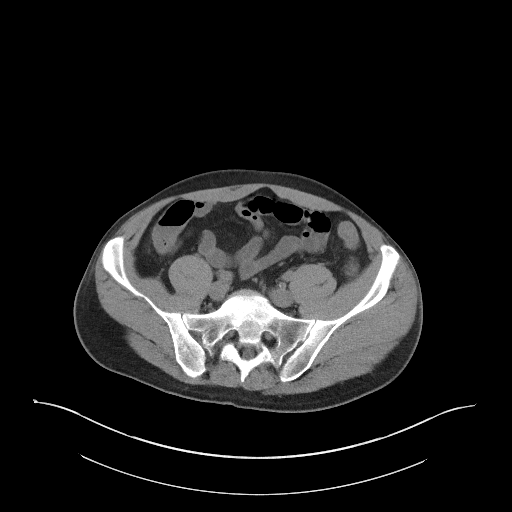
[im 51/97  soft-tissue]
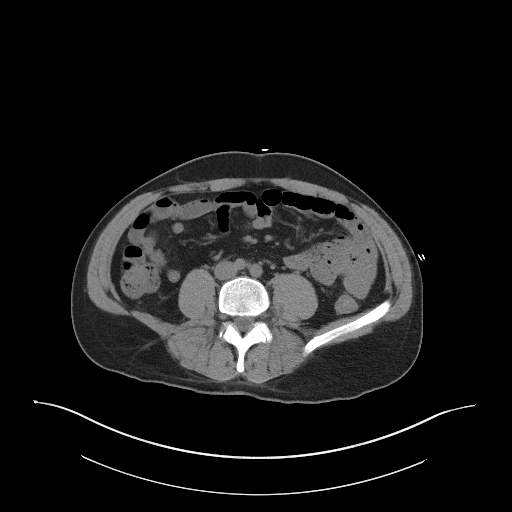
[im 57/97  soft-tissue]
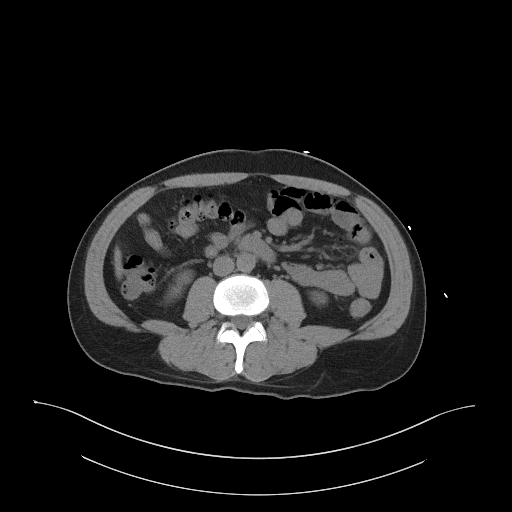
[im 63/97  soft-tissue]
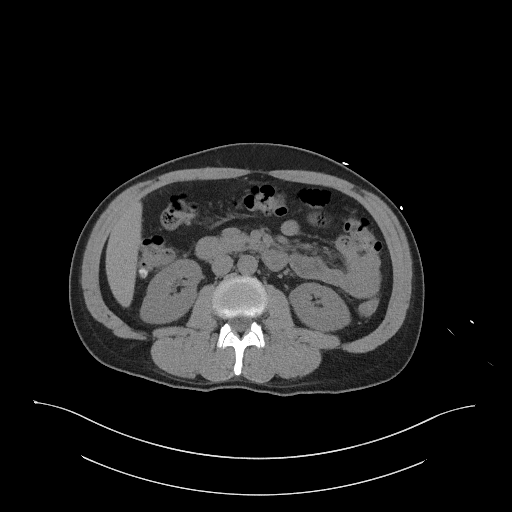
[im 63/97  bone]
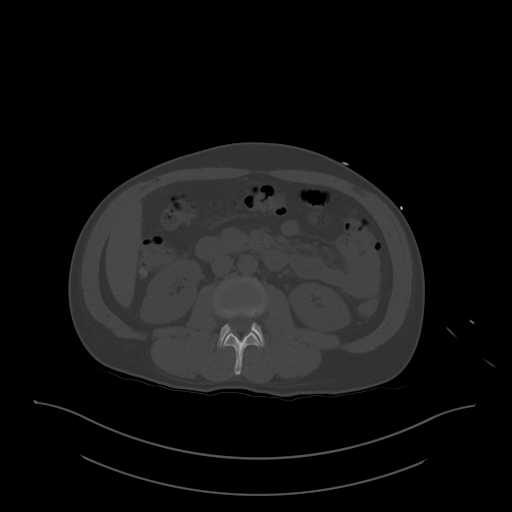
[im 68/97  soft-tissue]
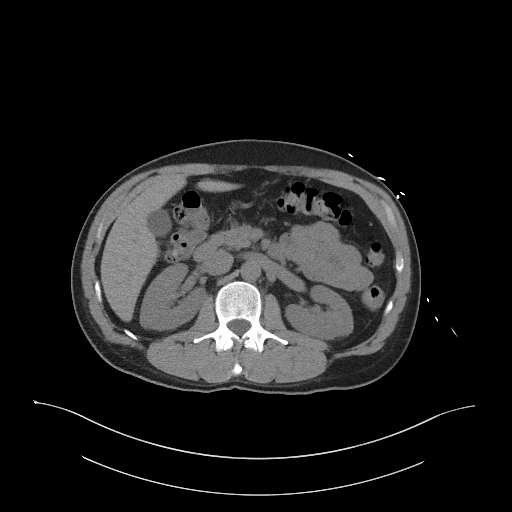
[im 74/97  soft-tissue]
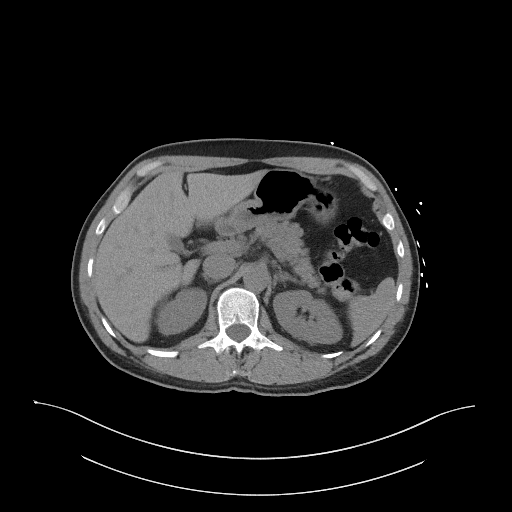
[im 85/97  soft-tissue]
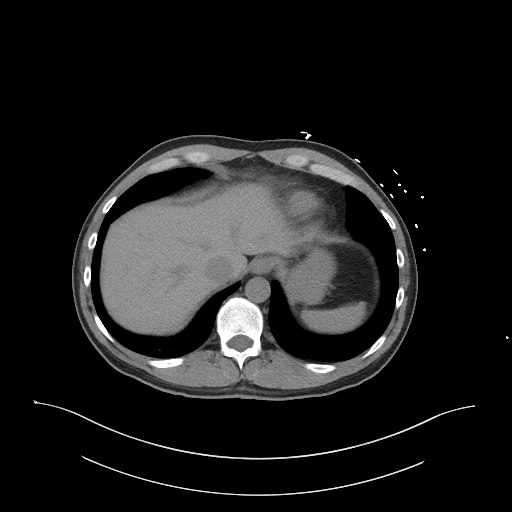
[im 91/97  soft-tissue]
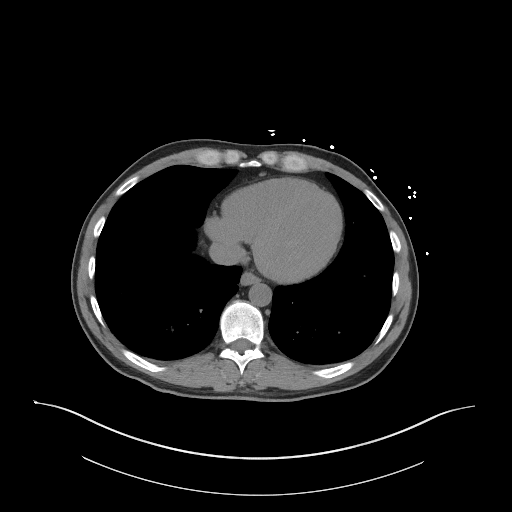

[Series 5: coronal st · coronal · 0.86mm/px · 3 of 108 slices shown]
[im 36/108  soft-tissue]
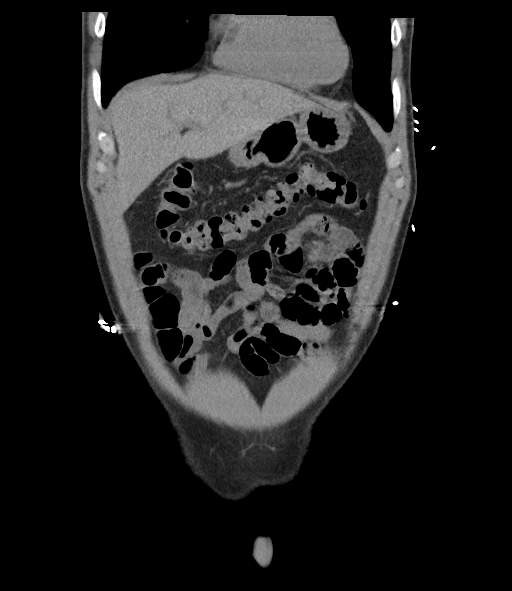
[im 48/108  soft-tissue]
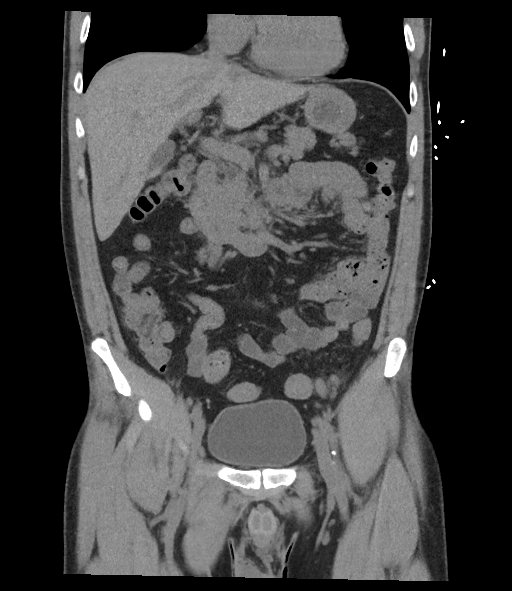
[im 60/108  soft-tissue]
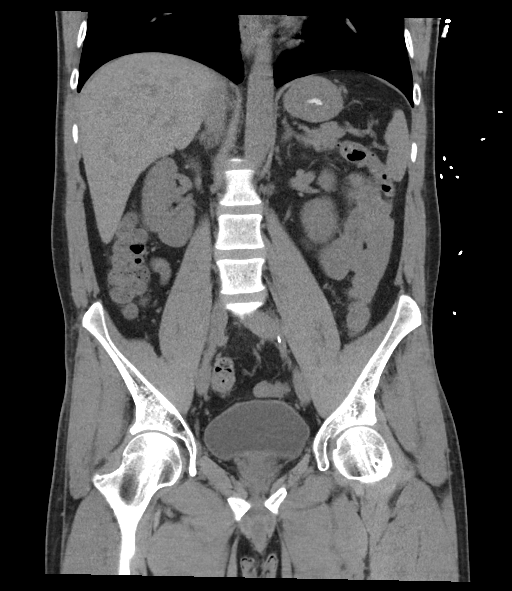

[16 of 46 positions shown; findings below may reference images not displayed]

FINDINGS: Lower chest: No acute abnormality.

Hepatobiliary: No solid liver abnormality is seen. No gallstones,
gallbladder wall thickening, or biliary dilatation.

Pancreas: Unremarkable. No pancreatic ductal dilatation or
surrounding inflammatory changes.

Spleen: Normal in size without significant abnormality.

Adrenals/Urinary Tract: Adrenal glands are unremarkable. Kidneys are
normal, without renal calculi, solid lesion, or hydronephrosis.
Bladder is unremarkable.

Stomach/Bowel: Stomach is within normal limits. Appendix appears
normal. No evidence of bowel wall thickening, distention, or
inflammatory changes. Scattered cecal diverticula.

Vascular/Lymphatic: No significant vascular findings are present. No
enlarged abdominal or pelvic lymph nodes.

Reproductive: No mass or other significant abnormality.

Other: No abdominal wall hernia or abnormality. No abdominopelvic
ascites.

Musculoskeletal: No acute or significant osseous findings.
IMPRESSION: 1. No non-contrast CT findings of the abdomen or pelvis to explain
right flank pain. No evidence of urinary tract calculus or
hydronephrosis.
2. Scattered cecal diverticula without evidence of acute
diverticulitis.
